# Patient Record
Sex: Female | Born: 1970 | Race: White | Hispanic: No | Marital: Married | Smoking: Never smoker
Health system: Southern US, Community
[De-identification: ages and names within clinical notes are randomized; demographics above are authoritative.]

## PROBLEM LIST (undated history)

## (undated) DIAGNOSIS — K219 Gastro-esophageal reflux disease without esophagitis: Secondary | ICD-10-CM

## (undated) DIAGNOSIS — Z973 Presence of spectacles and contact lenses: Secondary | ICD-10-CM

## (undated) DIAGNOSIS — N920 Excessive and frequent menstruation with regular cycle: Secondary | ICD-10-CM

## (undated) DIAGNOSIS — R112 Nausea with vomiting, unspecified: Secondary | ICD-10-CM

## (undated) DIAGNOSIS — J189 Pneumonia, unspecified organism: Secondary | ICD-10-CM

## (undated) DIAGNOSIS — Z9889 Other specified postprocedural states: Secondary | ICD-10-CM

## (undated) DIAGNOSIS — F419 Anxiety disorder, unspecified: Secondary | ICD-10-CM

## (undated) HISTORY — PX: BACK SURGERY: SHX140

## (undated) HISTORY — PX: OTHER SURGICAL HISTORY: SHX169

---

## 1999-12-09 ENCOUNTER — Other Ambulatory Visit: Admission: RE | Admit: 1999-12-09 | Discharge: 1999-12-09 | Payer: Self-pay | Admitting: Obstetrics & Gynecology

## 2001-01-20 ENCOUNTER — Other Ambulatory Visit: Admission: RE | Admit: 2001-01-20 | Discharge: 2001-01-20 | Payer: Self-pay | Admitting: Obstetrics & Gynecology

## 2002-03-29 ENCOUNTER — Inpatient Hospital Stay (HOSPITAL_COMMUNITY): Admission: AD | Admit: 2002-03-29 | Discharge: 2002-04-01 | Payer: Self-pay | Admitting: Obstetrics & Gynecology

## 2002-05-18 ENCOUNTER — Other Ambulatory Visit: Admission: RE | Admit: 2002-05-18 | Discharge: 2002-05-18 | Payer: Self-pay | Admitting: Obstetrics & Gynecology

## 2003-06-08 ENCOUNTER — Other Ambulatory Visit: Admission: RE | Admit: 2003-06-08 | Discharge: 2003-06-08 | Payer: Self-pay | Admitting: Obstetrics & Gynecology

## 2004-08-12 ENCOUNTER — Other Ambulatory Visit: Admission: RE | Admit: 2004-08-12 | Discharge: 2004-08-12 | Payer: Self-pay | Admitting: Obstetrics & Gynecology

## 2005-09-29 ENCOUNTER — Other Ambulatory Visit: Admission: RE | Admit: 2005-09-29 | Discharge: 2005-09-29 | Payer: Self-pay | Admitting: Obstetrics & Gynecology

## 2008-11-01 LAB — CONVERTED CEMR LAB: Pap Smear: NORMAL

## 2008-12-22 HISTORY — PX: INTRAUTERINE DEVICE (IUD) INSERTION: SHX5877

## 2009-01-17 ENCOUNTER — Ambulatory Visit: Payer: Self-pay | Admitting: Internal Medicine

## 2009-03-30 ENCOUNTER — Ambulatory Visit: Payer: Self-pay | Admitting: Internal Medicine

## 2009-05-08 ENCOUNTER — Ambulatory Visit: Payer: Self-pay | Admitting: Internal Medicine

## 2009-05-08 DIAGNOSIS — R635 Abnormal weight gain: Secondary | ICD-10-CM | POA: Insufficient documentation

## 2009-05-08 DIAGNOSIS — J020 Streptococcal pharyngitis: Secondary | ICD-10-CM

## 2009-05-08 LAB — CONVERTED CEMR LAB
AST: 27 units/L (ref 0–37)
Alkaline Phosphatase: 45 units/L (ref 39–117)
Bilirubin Urine: NEGATIVE
GFR calc non Af Amer: 145.58 mL/min (ref >60)
Glucose, Bld: 75 mg/dL (ref 70–99)
HCT: 33.8 % — ABNORMAL LOW (ref 36.0–46.0)
Hemoglobin: 12 g/dL (ref 12.0–15.0)
Leukocytes, UA: NEGATIVE
Lymphs Abs: 1.7 10*3/uL (ref 0.7–4.0)
MCHC: 35.4 g/dL (ref 30.0–36.0)
MCV: 87.8 fL (ref 78.0–100.0)
Monocytes Absolute: 0.5 10*3/uL (ref 0.1–1.0)
Monocytes Relative: 5.8 % (ref 3.0–12.0)
Neutrophils Relative %: 74.3 % (ref 43.0–77.0)
Nitrite: NEGATIVE
RBC: 3.85 M/uL — ABNORMAL LOW (ref 3.87–5.11)
Urine Glucose: NEGATIVE mg/dL
WBC: 8.6 10*3/uL (ref 4.5–10.5)

## 2009-05-14 ENCOUNTER — Encounter: Admission: RE | Admit: 2009-05-14 | Discharge: 2009-05-14 | Payer: Self-pay | Admitting: Internal Medicine

## 2009-05-14 ENCOUNTER — Telehealth: Payer: Self-pay | Admitting: Internal Medicine

## 2009-06-01 ENCOUNTER — Ambulatory Visit: Payer: Self-pay | Admitting: Endocrinology

## 2009-06-01 DIAGNOSIS — R61 Generalized hyperhidrosis: Secondary | ICD-10-CM

## 2009-12-28 ENCOUNTER — Encounter: Admission: RE | Admit: 2009-12-28 | Discharge: 2009-12-28 | Payer: Self-pay | Admitting: Obstetrics & Gynecology

## 2010-12-30 ENCOUNTER — Encounter
Admission: RE | Admit: 2010-12-30 | Discharge: 2010-12-30 | Payer: Self-pay | Source: Home / Self Care | Attending: Obstetrics & Gynecology | Admitting: Obstetrics & Gynecology

## 2011-05-09 NOTE — Discharge Summary (Signed)
[facility]  Patient:    Stephanie [name], Stephanie [name] Visit Number: [id number] MRN: [id number]          Service Type: OBS Location: 910A 9119 01 Attending Physician:  Minette Headland Dictated by:   Danie Chandler, R.N. Admit Date:  03/29/2002 Disc. Date: 04/01/02                             Discharge Summary  ADMISSION DIAGNOSES:          1. Intrauterine pregnancy at term.                               2. Surgically scarred uterus.                               3. For repeat cesarean delivery.  DISCHARGE DIAGNOSES:          1. Intrauterine pregnancy at term.                               2. Surgically scarred uterus.                               3. For repeat cesarean delivery.  PROCEDURES:                   On March 29, 2002, repeat low transverse cervical cesarean section.  REASON FOR ADMISSION:         Please see the dictated H&P.  HOSPITAL COURSE:              The patient was taken to the operating room and underwent the above-named procedure without complication.  This was productive of a viable female infant with Apgars of 9 at one minute and 9 at five minutes and an arterial cord pH of 7.24.  Postoperatively on day #1, the patients hemoglobin was 9.2, hematocrit 26.9, and white blood cell count 11.4.  The patient was tolerating liquids.  She was complaining of some itching and using Benadryl as needed.  On postoperative day #2, her itching was resolved.  She had good return of bowel function and was tolerating a regular diet.  She also had good pain control and was ambulating well without difficulty.  DISPOSITION:                  She was discharged home on postoperative day #3.  CONDITION ON DISCHARGE:       Good.  DIET:                         Regular as tolerated.  ACTIVITY:                     No heavy lifting.  No driving.  No vaginal entry.  FOLLOW-UP:                    She is to follow up in the office in two weeks for an incision  check.  SPECIAL INSTRUCTIONS:         She is to call for a temperature greater than 100 degrees, persistent nausea and vomiting, heavy vaginal bleeding, and/or  redness or drainage from the incision site.  DISCHARGE MEDICATIONS:        1. Prenatal vitamins one p.o. q.d.                               2. Motrin 600 mg every six hours as needed.                               3. Percocet 5 mg, #20, one to two p.o. q.6-8h.                                  p.r.n. pain. Dictated by:   Danie Chandler, R.N. Attending Physician:  Minette Headland DD:  04/01/02 TD:  04/02/02 Job: [id number]

## 2011-05-09 NOTE — H&P (Signed)
[facility]  PatientALDINA, Stephanie [name] Visit Number: [id number] MRN: [id number]          Service Type: Attending:  Freddy Finner, M.D. Dictated by:   Freddy Finner, M.D. Adm. Date:  03/29/02                           History and Physical  ADMITTING DIAGNOSIS:          Intrauterine pregnancy at term.  Surgically scarred uterus by two previous cesarean deliveries.  HISTORY:                      The patient is a 40 year old white married female, gravida 3, para 2 both by cesarean, who is now admitted after an uncomplicated prenatal course for repeat cesarean section.  Her current review of systems is negative.  PAST HISTORY:                 Recorded in detail in the prenatal record and will not be repeated.  PHYSICAL EXAMINATION:  HEENT:                        Grossly within normal limits.  Thyroid gland was not palpably enlarged.  Blood pressure in the office is 122/76.  CHEST:                        Clear to auscultation.  HEART:                        Normal sinus rhythm without murmurs, rubs or gallops.  ABDOMEN:                      Gravid.  Estimated fetal size of 8.5 pounds.  EXTREMITIES:                  +1 edema without cyanosis or clubbing.  ASSESSMENT:                   Intrauterine pregnancy at term, surgically scarred uterus.  PLAN:                         Cesarean delivery. Dictated by:   Freddy Finner, M.D. Attending:  Freddy Finner, M.D. DD:  03/28/02 TD:  03/28/02 Job: [id number]

## 2011-05-09 NOTE — Op Note (Signed)
[facility]  Patient:    Stephanie [name], Stephanie [name] Visit Number: [id number] MRN: [id number]          Service Type: OBS Location: 910A 9119 01 Attending Physician:  Minette Headland Dictated by:   Freddy Finner, M.D. Proc. Date: 03/29/02 Admit Date:  03/29/2002                             Operative Report  PREOPERATIVE DIAGNOSES:       1. Intrauterine pregnancy at term.                               2. Surgically scarred uterus.                               3. For repeat cesarean delivery.  POSTOPERATIVE DIAGNOSES:      1. Intrauterine pregnancy at term.                               2. Surgically scarred uterus.                               3. For repeat cesarean delivery.  OPERATION:                    Repeat low transverse cervical cesarean section.                               Delivery of a viable female infant, Apgars of 9                               and 9, arterial cord pH of 7.24.  SURGEON:                      Freddy Finner, M.D.  ANESTHESIA:                   Spinal.  ESTIMATED BLOOD LOSS:         600-800 cc.  INTRAOPERATIVE COMPLICATIONS:  None.  INDICATIONS:                  The patient is a 40 year old admitted at term for repeat cesarean delivery.  DESCRIPTION OF PROCEDURE:     She was brought in on the morning of surgery, brought to the operating room.  There she had placement of adequate spinal anesthesia, placed in the dorsal recumbent position with elevation of the right hip.  Abdomen was prepped and draped in the usual fashion.  A Foley catheter was placed using sterile technique.  Sterile drapes were applied.  A low abdominal transverse incision and carried sharply down to fascia.  The fascia was entered sharply and extended to the extent of the skin incision. The rectus sheath was developed superiorly and inferiorly with blunt and sharp dissection.  Rectus muscle was divided in the midline.  The peritoneum was entered  sharply and extended bluntly and sharply to the extent of the skin incision.  Bladder blade was placed.  Transverse incision was made initially in the peritoneum  overlying the lower uterine segment.  The bladder was bluntly dissected off the lower segment.  A transverse incision was made in the lower uterine segment and extended bluntly in a transverse direction.  The membranes were ruptured, fluid was clear.  Using a vacuum extractor, a viable female infant was then delivered without significant difficulty.  Incision was made in the left rectus muscle to allow additional room.  Infants birth weight was 9 pounds, Apgars of 9 and 9, assigned to Dagoberto Ligas, M.D. Arterial cord pH of 7.24.  The infant was a viable female.  Cord blood was obtained for routine and for arterial cord blood for gases.  The placenta was removed from the uterus with manual exploration, and manual removal of placenta was confirmed complete by exploration of the uterine cavity.  The edges of the uterine incision were grasped with ring forceps.  The incision was closed with running locking 0 Monocryl.  Additional suturing on the left was required for complete hemostasis.  Bladder flap was reapproximated with a single interrupted 0 Monocryl suture.  Tubes and ovaries were inspected and found to be normal.  Uterus was palpably normal.  Irrigation was carried out. The counts were correct.  The abdominal incision was closed in layers, and running 0 Monocryl was used to close the peritoneum and reapproximate the rectus muscles as well as repairing the defect created by the incision in the left rectus muscle.  Irrigation again was carried out.  Hemostasis was complete.  Fascia was closed with running 0 PDS.  Skin was closed with wide skin staples and quarter-inch Steri-Strips.  The patient tolerated the procedure well, was taken to recovery in good condition. Dictated by:   Freddy Finner, M.D. Attending Physician:  Minette Headland DD:  03/29/02 TD:  03/29/02 Job: [id number]

## 2011-10-30 ENCOUNTER — Other Ambulatory Visit: Payer: Self-pay | Admitting: Otolaryngology

## 2011-10-30 DIAGNOSIS — I8290 Acute embolism and thrombosis of unspecified vein: Secondary | ICD-10-CM

## 2011-11-05 ENCOUNTER — Other Ambulatory Visit: Payer: Self-pay

## 2011-12-29 ENCOUNTER — Ambulatory Visit
Admission: RE | Admit: 2011-12-29 | Discharge: 2011-12-29 | Disposition: A | Discharge status: Home / Self Care | Payer: 59 | Source: Ambulatory Visit | Attending: Otolaryngology | Admitting: Otolaryngology

## 2011-12-29 DIAGNOSIS — I8290 Acute embolism and thrombosis of unspecified vein: Secondary | ICD-10-CM

## 2011-12-29 MED ORDER — GADOBENATE DIMEGLUMINE 529 MG/ML IV SOLN
20.0000 mL | Freq: Once | INTRAVENOUS | Status: AC | PRN
  Administered 2011-12-29: 20 mL via INTRAVENOUS

## 2012-01-01 ENCOUNTER — Other Ambulatory Visit (HOSPITAL_COMMUNITY): Payer: Self-pay | Admitting: Otolaryngology

## 2012-01-01 DIAGNOSIS — I771 Stricture of artery: Secondary | ICD-10-CM

## 2012-01-02 ENCOUNTER — Encounter (HOSPITAL_COMMUNITY): Payer: Self-pay | Admitting: Pharmacy Technician

## 2012-01-07 ENCOUNTER — Ambulatory Visit (HOSPITAL_COMMUNITY)
Admission: RE | Admit: 2012-01-07 | Discharge: 2012-01-07 | Disposition: A | Discharge status: Home / Self Care | Payer: 59 | Source: Ambulatory Visit | Attending: Otolaryngology | Admitting: Otolaryngology

## 2012-01-07 ENCOUNTER — Other Ambulatory Visit (HOSPITAL_COMMUNITY): Payer: Self-pay | Admitting: Interventional Radiology

## 2012-01-07 DIAGNOSIS — G08 Intracranial and intraspinal phlebitis and thrombophlebitis: Secondary | ICD-10-CM

## 2012-01-07 DIAGNOSIS — I771 Stricture of artery: Secondary | ICD-10-CM

## 2012-01-08 ENCOUNTER — Other Ambulatory Visit (HOSPITAL_COMMUNITY): Payer: Self-pay | Admitting: Physician Assistant

## 2012-01-13 ENCOUNTER — Encounter (HOSPITAL_COMMUNITY): Payer: Self-pay | Admitting: Pharmacy Technician

## 2012-01-19 ENCOUNTER — Encounter (HOSPITAL_COMMUNITY): Payer: Self-pay

## 2012-01-19 ENCOUNTER — Encounter (HOSPITAL_COMMUNITY)
Admission: RE | Admit: 2012-01-19 | Discharge: 2012-01-19 | Disposition: A | Discharge status: Home / Self Care | Payer: 59 | Source: Ambulatory Visit | Attending: Anesthesiology | Admitting: Anesthesiology

## 2012-01-19 ENCOUNTER — Encounter (HOSPITAL_COMMUNITY)
Admission: RE | Admit: 2012-01-19 | Discharge: 2012-01-19 | Disposition: A | Discharge status: Home / Self Care | Payer: 59 | Source: Ambulatory Visit | Attending: Interventional Radiology | Admitting: Interventional Radiology

## 2012-01-19 ENCOUNTER — Other Ambulatory Visit: Payer: Self-pay

## 2012-01-19 HISTORY — DX: Nausea with vomiting, unspecified: R11.2

## 2012-01-19 HISTORY — DX: Other specified postprocedural states: Z98.890

## 2012-01-19 LAB — SURGICAL PCR SCREEN: MRSA, PCR: NEGATIVE

## 2012-01-19 LAB — BASIC METABOLIC PANEL
BUN: 9 mg/dL (ref 6–23)
Chloride: 104 mEq/L (ref 96–112)
Glucose, Bld: 78 mg/dL (ref 70–99)
Potassium: 4.2 mEq/L (ref 3.5–5.1)
Sodium: 139 mEq/L (ref 135–145)

## 2012-01-19 LAB — CBC
HCT: 42.5 % (ref 36.0–46.0)
Hemoglobin: 14.4 g/dL (ref 12.0–15.0)
MCH: 29.9 pg (ref 26.0–34.0)
MCHC: 33.9 g/dL (ref 30.0–36.0)
RBC: 4.82 MIL/uL (ref 3.87–5.11)

## 2012-01-19 LAB — HCG, SERUM, QUALITATIVE: Preg, Serum: NEGATIVE

## 2012-01-19 LAB — PROTIME-INR: INR: 1.04 (ref 0.00–1.49)

## 2012-01-19 LAB — DIFFERENTIAL
Basophils Relative: 1 % (ref 0–1)
Eosinophils Absolute: 0.1 10*3/uL (ref 0.0–0.7)
Lymphs Abs: 2.2 10*3/uL (ref 0.7–4.0)
Monocytes Absolute: 0.6 10*3/uL (ref 0.1–1.0)
Monocytes Relative: 6 % (ref 3–12)
Neutro Abs: 5.9 10*3/uL (ref 1.7–7.7)
Neutrophils Relative %: 67 % (ref 43–77)

## 2012-01-19 NOTE — Progress Notes (Signed)
Pt doesn't have a cardiologist and has never had a stress test/echo/heart cath

## 2012-01-19 NOTE — Progress Notes (Signed)
pts mom gets sick with anethesia

## 2012-01-19 NOTE — Pre-Procedure Instructions (Signed)
20 Stephanie [name]  01/19/2012   Your procedure is scheduled on:  Mon,Feb 4 @ 0800  Report to [facility] at 0600 AM.  Call this number if you have problems the morning of surgery: [phone]   Remember:   Do not eat food:After Midnight.  May have clear liquids: up to 4 Hours before arrival.(until 2:00am)  Clear liquids include soda, tea, black coffee, apple or grape juice, broth.  Take these medicines the morning of surgery with A SIP OF WATER:    Do not wear jewelry, make-up or nail polish.  Do not wear lotions, powders, or perfumes. You may wear deodorant.  Do not shave 48 hours prior to surgery.  Do not bring valuables to the hospital.  Contacts, dentures or bridgework may not be worn into surgery.  Leave suitcase in the car. After surgery it may be brought to your room.  For patients admitted to the hospital, checkout time is 11:00 AM the day of discharge.   Patients discharged the day of surgery will not be allowed to drive home.  Name and phone number of your driver:  Special Instructions: CHG Shower Use Special Wash: 1/2 bottle night before surgery and 1/2 bottle morning of surgery.   Please read over the following fact sheets that you were given: Pain Booklet, Coughing and Deep Breathing, MRSA Information and Surgical Site Infection Prevention

## 2012-01-20 NOTE — Consult Note (Signed)
Anesthesia:  Patient is a 41 year old female scheduled for a cerebral angiogram with possible angioplasty or stenting of right transverse sinus on 01/26/12.  Other history includes post-op N/V, "pinched nerve."  Labs noted.  EKG show NSR. CXR shows mild bronchitic changes. Plan to proceed.

## 2012-01-21 ENCOUNTER — Other Ambulatory Visit: Payer: Self-pay | Admitting: Radiology

## 2012-01-22 ENCOUNTER — Other Ambulatory Visit (HOSPITAL_COMMUNITY): Payer: Self-pay | Admitting: Physician Assistant

## 2012-01-26 ENCOUNTER — Ambulatory Visit (HOSPITAL_COMMUNITY): Payer: 59 | Admitting: Vascular Surgery

## 2012-01-26 ENCOUNTER — Ambulatory Visit (HOSPITAL_COMMUNITY)
Admission: RE | Admit: 2012-01-26 | Discharge: 2012-01-26 | Disposition: A | Discharge status: Home / Self Care | Payer: 59 | Source: Ambulatory Visit | Attending: Interventional Radiology | Admitting: Interventional Radiology

## 2012-01-26 ENCOUNTER — Encounter (HOSPITAL_COMMUNITY): Payer: Self-pay | Admitting: Vascular Surgery

## 2012-01-26 ENCOUNTER — Encounter (HOSPITAL_COMMUNITY): Payer: Self-pay

## 2012-01-26 ENCOUNTER — Encounter (HOSPITAL_COMMUNITY)
Admission: RE | Disposition: A | Discharge status: Home / Self Care | Payer: Self-pay | Source: Ambulatory Visit | Attending: Interventional Radiology

## 2012-01-26 DIAGNOSIS — G08 Intracranial and intraspinal phlebitis and thrombophlebitis: Secondary | ICD-10-CM

## 2012-01-26 DIAGNOSIS — H9319 Tinnitus, unspecified ear: Secondary | ICD-10-CM | POA: Insufficient documentation

## 2012-01-26 DIAGNOSIS — Z01810 Encounter for preprocedural cardiovascular examination: Secondary | ICD-10-CM | POA: Insufficient documentation

## 2012-01-26 DIAGNOSIS — Z01812 Encounter for preprocedural laboratory examination: Secondary | ICD-10-CM | POA: Insufficient documentation

## 2012-01-26 DIAGNOSIS — I10 Essential (primary) hypertension: Secondary | ICD-10-CM | POA: Insufficient documentation

## 2012-01-26 DIAGNOSIS — R9409 Abnormal results of other function studies of central nervous system: Secondary | ICD-10-CM | POA: Insufficient documentation

## 2012-01-26 DIAGNOSIS — Z01818 Encounter for other preprocedural examination: Secondary | ICD-10-CM | POA: Insufficient documentation

## 2012-01-26 LAB — PLATELET INHIBITION P2Y12: Platelet Function  P2Y12: 171 [PRU] — ABNORMAL LOW (ref 194–418)

## 2012-01-26 SURGERY — RADIOLOGY WITH ANESTHESIA
Anesthesia: General

## 2012-01-26 MED ORDER — CEFAZOLIN SODIUM-DEXTROSE 2-3 GM-% IV SOLR: 2.0000 g | INTRAVENOUS | Status: DC

## 2012-01-26 MED ORDER — LACTATED RINGERS IV SOLN: INTRAVENOUS | Status: DC | PRN

## 2012-01-26 MED ORDER — HYDROMORPHONE HCL PF 1 MG/ML IJ SOLN
INTRAMUSCULAR | Status: AC
  Filled 2012-01-26: qty 1

## 2012-01-26 MED ORDER — CEFAZOLIN SODIUM-DEXTROSE 2-3 GM-% IV SOLR
INTRAVENOUS | Status: AC
  Filled 2012-01-26: qty 50

## 2012-01-26 MED ORDER — MIDAZOLAM HCL 5 MG/5ML IJ SOLN
INTRAMUSCULAR | Status: DC | PRN
  Administered 2012-01-26: 1 mg via INTRAVENOUS

## 2012-01-26 MED ORDER — CEFAZOLIN SODIUM 1-5 GM-% IV SOLN: 1.0000 g | INTRAVENOUS | Status: DC

## 2012-01-26 MED ORDER — CLOPIDOGREL BISULFATE 75 MG PO TABS
ORAL_TABLET | ORAL | Status: AC
  Filled 2012-01-26: qty 1

## 2012-01-26 MED ORDER — NIMODIPINE 30 MG PO CAPS
ORAL_CAPSULE | ORAL | Status: AC
  Filled 2012-01-26: qty 2

## 2012-01-26 MED ORDER — NIMODIPINE 30 MG PO CAPS
60.0000 mg | ORAL_CAPSULE | ORAL | Status: AC
Start: 2012-01-26 — End: 2012-01-26
  Administered 2012-01-26: 60 mg via ORAL

## 2012-01-26 MED ORDER — CLOPIDOGREL BISULFATE 75 MG PO TABS
75.0000 mg | ORAL_TABLET | ORAL | Status: AC
  Administered 2012-01-26: 75 mg via ORAL

## 2012-01-26 MED ORDER — HYDROMORPHONE HCL PF 1 MG/ML IJ SOLN
0.2500 mg | INTRAMUSCULAR | Status: DC | PRN
  Administered 2012-01-26: 0.25 mg via INTRAVENOUS

## 2012-01-26 MED ORDER — IOHEXOL 300 MG/ML  SOLN
300.0000 mL | Freq: Once | INTRAMUSCULAR | Status: AC | PRN
  Administered 2012-01-26: 95 mL via INTRA_ARTERIAL

## 2012-01-26 MED ORDER — SODIUM CHLORIDE 0.9 % IV SOLN: INTRAVENOUS | Status: DC

## 2012-01-26 MED ORDER — SODIUM CHLORIDE 0.9 % IV SOLN
INTRAVENOUS | Status: DC | PRN
  Administered 2012-01-26: 08:00:00 via INTRAVENOUS

## 2012-01-26 MED ORDER — FENTANYL CITRATE 0.05 MG/ML IJ SOLN
INTRAMUSCULAR | Status: DC | PRN
  Administered 2012-01-26 (×2): 50 ug via INTRAVENOUS

## 2012-01-26 MED ORDER — DROPERIDOL 2.5 MG/ML IJ SOLN: 0.6250 mg | INTRAMUSCULAR | Status: DC | PRN

## 2012-01-26 MED ORDER — ASPIRIN EC 325 MG PO TBEC
325.0000 mg | DELAYED_RELEASE_TABLET | ORAL | Status: AC
  Administered 2012-01-26: 325 mg via ORAL

## 2012-01-26 MED ORDER — SODIUM CHLORIDE 0.9 % IV SOLN
10.0000 mg | INTRAVENOUS | Status: DC | PRN
  Administered 2012-01-26: 1 ug via INTRAVENOUS

## 2012-01-26 MED ORDER — ASPIRIN EC 325 MG PO TBEC
DELAYED_RELEASE_TABLET | ORAL | Status: AC
  Filled 2012-01-26: qty 1

## 2012-01-26 NOTE — Procedures (Signed)
S/P 4 vessel cerebral arteriogram  Rt CFA approach . Preliminary findings  1.70  To 75 % stenosis of rt  transverse  sinus ,distally

## 2012-01-26 NOTE — Preoperative (Signed)
Beta Blockers   Reason not to administer Beta Blockers:Not Applicable 

## 2012-01-26 NOTE — H&P (Signed)
Stephanie [name] is an 41 y.o. female.   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 PONV (postoperative nausea and vomiting)   . Pinched nerve     left side-leg    Past Surgical History  Procedure Date  . Cyst on throat     at age 48  . Cesarean section 94/97/03    Family History  Problem Relation Age of Onset  . Anesthesia problems Mother    Social History:  reports that she has never smoked. She does not have any smokeless tobacco history on file. She reports that she does not drink alcohol or use illicit drugs.  Allergies:  Allergies  Allergen Reactions  . Morphine And Related Itching    Medications Prior to Admission  Medication Dose Route Frequency Provider Last Rate Last Dose  . 0.9 %  sodium chloride infusion   Intravenous Continuous Oneal Grout, MD      . aspirin EC 325 MG tablet           . aspirin EC tablet 325 mg  325 mg Oral 60 min Pre-Op Oneal Grout, MD      . ceFAZolin (ANCEF) 2-3 GM-% IVPB SOLR           . ceFAZolin (ANCEF) IVPB 2 g/50 mL premix  2 g Intravenous 60 min Pre-Op Oneal Grout, MD      . clopidogrel (PLAVIX) 75 MG tablet           . clopidogrel (PLAVIX) tablet 75 mg  75 mg Oral 60 min Pre-Op Oneal Grout, MD      . niMODipine (NIMOTOP) 30 MG capsule           . niMODipine (NIMOTOP) capsule 60 mg  60 mg Oral 60 min Pre-Op Oneal Grout, MD      . phenylephrine (NEO-SYNEPHRINE) 0.04 mg/mL in sodium chloride 0.9 % 250 mL infusion  10 mg  Continuous PRN Sande Brothers, CRNA   1 mcg at 01/26/12 0720  . DISCONTD: ceFAZolin (ANCEF) IVPB 1 g/50 mL premix  1 g Intravenous 60 min Pre-Op Oneal Grout, MD       Medications Prior to Admission  Medication Sig Dispense Refill  . aspirin 325 MG tablet Take 325 mg by mouth daily.      . clopidogrel  (PLAVIX) 75 MG tablet Take 75 mg by mouth daily.        Results for orders placed during the hospital encounter of 01/26/12 (from the past 48 hour(s))  PLATELET INHIBITION P2Y12     Status: Abnormal   Collection Time   01/26/12  6:42 AM      Component Value Range Comment   Platelet Function  P2Y12 171 (*) 194 - 418 (PRU)    No results found.  Review of Systems  Constitutional: Negative for fever.  Eyes: Negative for blurred vision.  Respiratory: Negative for cough.   Cardiovascular: Negative for chest pain.  Gastrointestinal: Negative for nausea, vomiting and abdominal pain.  Neurological: Negative for headaches.    Last menstrual period 12/19/2011. Physical Exam  Constitutional: She is oriented to person, place, and time. She appears well-developed and well-nourished.  HENT:  Head: Normocephalic.  Eyes: EOM are normal.  Neck: Normal range of motion.  Cardiovascular: Normal rate and regular rhythm.   No murmur heard. Respiratory: Effort normal  and breath sounds normal. She has no wheezes.  GI: Soft. Bowel sounds are normal. There is no tenderness.  Musculoskeletal: Normal range of motion.  Neurological: She is alert and oriented to person, place, and time.  Skin: Skin is warm and dry.     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 Consent signed.Pt understands if intervention performed she will be admitted to Neuro ICU overnight  Macallister Ashmead A 01/26/2012, 7:21 AM

## 2012-01-26 NOTE — Anesthesia Preprocedure Evaluation (Signed)
Anesthesia Evaluation  Patient identified by MRN, date of birth, ID band Patient awake    Reviewed: Allergy & Precautions, H&P , NPO status , Patient's Chart, lab work & pertinent test results  History of Anesthesia Complications (+) PONVNegative for: history of anesthetic complications  Airway Mallampati: II TM Distance: >3 FB Neck ROM: Full    Dental  (+) Dental Advisory Given   Pulmonary neg pulmonary ROS,          Cardiovascular hypertension,  Pt sts borderline no RX   Neuro/Psych Negative Psych ROS   GI/Hepatic negative GI ROS, Neg liver ROS,   Endo/Other  Negative Endocrine ROS  Renal/GU negative Renal ROS  Genitourinary negative   Musculoskeletal negative musculoskeletal ROS (+)   Abdominal   Peds negative pediatric ROS (+)  Hematology negative hematology ROS (+) On Plavix/ASA   Anesthesia Other Findings   Reproductive/Obstetrics negative OB ROS                           Anesthesia Physical Anesthesia Plan  ASA: III  Anesthesia Plan: General and MAC   Post-op Pain Management:    Induction: Intravenous  Airway Management Planned: Nasal Cannula and Oral ETT  Additional Equipment: Arterial line  Intra-op Plan:   Post-operative Plan: Extubation in OR and Possible Post-op intubation/ventilation  Informed Consent:   Dental advisory given  Plan Discussed with: CRNA, Anesthesiologist and Surgeon  Anesthesia Plan Comments:         Anesthesia Quick Evaluation

## 2012-01-26 NOTE — Transfer of Care (Signed)
Immediate Anesthesia Transfer of Care Note  Patient: Stephanie [name]  Procedure(s) Performed:  RADIOLOGY WITH ANESTHESIA  Patient Location: PACU  Anesthesia Type: MAC  Level of Consciousness: awake, alert  and oriented  Airway & Oxygen Therapy: Patient Spontanous Breathing  Post-op Assessment: Report given to PACU RN and Post -op Vital signs reviewed and stable  Post vital signs: Reviewed and stable  Complications: No apparent anesthesia complications

## 2012-01-26 NOTE — Anesthesia Postprocedure Evaluation (Signed)
Anesthesia Post Note  Patient: Stephanie [name]  Procedure(s) Performed:  RADIOLOGY WITH ANESTHESIA  Anesthesia type: MAC  Patient location: PACU  Post pain: Pain level controlled  Post assessment: Patient's Cardiovascular Status Stable  Last Vitals:  Filed Vitals:   01/26/12 1308  BP: 113/59  Pulse: 84  Temp:   Resp: 20    Post vital signs: Reviewed and stable  Level of consciousness: sedated  Complications: No apparent anesthesia complications

## 2012-01-26 NOTE — ED Notes (Signed)
Vitals and Sedation Per anesthesia flowchart and records

## 2012-01-27 ENCOUNTER — Ambulatory Visit (INDEPENDENT_AMBULATORY_CARE_PROVIDER_SITE_OTHER): Payer: 59 | Admitting: Family Medicine

## 2012-01-27 ENCOUNTER — Encounter: Payer: Self-pay | Admitting: Family Medicine

## 2012-01-27 VITALS — BP 142/90 | HR 80 | Temp 98.3°F | Resp 16 | Ht 64.0 in | Wt 250.0 lb

## 2012-01-27 DIAGNOSIS — M25559 Pain in unspecified hip: Secondary | ICD-10-CM

## 2012-01-27 DIAGNOSIS — M5430 Sciatica, unspecified side: Secondary | ICD-10-CM

## 2012-01-27 MED ORDER — PREDNISONE 20 MG PO TABS: ORAL_TABLET | ORAL | Status: DC

## 2012-01-27 MED ORDER — CYCLOBENZAPRINE HCL 5 MG PO TABS: 5.0000 mg | ORAL_TABLET | Freq: Three times a day (TID) | ORAL | Status: AC | PRN

## 2012-01-27 MED ORDER — NAPROXEN 500 MG PO TABS: 500.0000 mg | ORAL_TABLET | Freq: Two times a day (BID) | ORAL | Status: DC

## 2012-01-27 NOTE — Patient Instructions (Signed)
Thank you for coming in today.  I appreciate your patience as we become more comfortable with our computer system.  Today you saw Stephanie Garland, MD. I hope you feel better quickly. If you were not given printed prescriptions today, your medications have been sent to your specified pharmacy and can be picked up there.  Please review the information below regarding your diagnosis(es) at your leisure.      Sciatica with Rehab The sciatic nerve runs from the back down the leg and is responsible for sensation and control of the muscles in the back (posterior) side of the thigh, lower leg, and foot. Sciatica is a condition that is characterized by inflammation of this nerve.  SYMPTOMS   Signs of nerve damage, including numbness and/or weakness along the posterior side of the lower extremity.   Pain in the back of the thigh that may also travel down the leg.   Pain that worsens when sitting for long periods of time.   Occasionally, pain in the back or buttock.  CAUSES  Inflammation of the sciatic nerve is the cause of sciatica. The inflammation is due to something irritating the nerve. Common sources of irritation include:  Sitting for long periods of time.   Direct trauma to the nerve.   Arthritis of the spine.   Herniated or ruptured disk.   Slipping of the vertebrae (spondylolithesis)   Pressure from soft tissues, such as muscles or ligament-like tissue (fascia).  RISK INCREASES WITH:  Sports that place pressure or stress on the spine (football or weightlifting).   Poor strength and flexibility.   Failure to warm-up properly before activity.   Family history of low back pain or disk disorders.   Previous back injury or surgery.   Poor body mechanics, especially when lifting, or poor posture.  PREVENTION   Warm up and stretch properly before activity.   Maintain physical fitness:   Strength, flexibility, and endurance.   Cardiovascular fitness.   Learn and use proper  technique, especially with posture and lifting. When possible, have coach correct improper technique.   Avoid activities that place stress on the spine.  PROGNOSIS If treated properly, then sciatica usually resolves within 6 weeks. However, occasionally surgery is necessary.  RELATED COMPLICATIONS   Permanent nerve damage, including pain, numbness, tingle, or weakness.   Chronic back pain.   Risks of surgery: infection, bleeding, nerve damage, or damage to surrounding tissues.  TREATMENT Treatment initially involves resting from any activities that aggravate your symptoms. The use of ice and medication may help reduce pain and inflammation. The use of strengthening and stretching exercises may help reduce pain with activity. These exercises may be performed at home or with referral to a therapist. A therapist may recommend further treatments, such as transcutaneous electronic nerve stimulation (TENS) or ultrasound. Your caregiver may recommend corticosteroid injections to help reduce inflammation of the sciatic nerve. If symptoms persist despite non-surgical (conservative) treatment, then surgery may be recommended. MEDICATION  If pain medication is necessary, then nonsteroidal anti-inflammatory medications, such as aspirin and ibuprofen, or other minor pain relievers, such as acetaminophen, are often recommended.   Do not take pain medication for 7 days before surgery.   Prescription pain relievers may be given if deemed necessary by your caregiver. Use only as directed and only as much as you need.   Ointments applied to the skin may be helpful.   Corticosteroid injections may be given by your caregiver. These injections should be reserved for the most  serious cases, because they may only be given a certain number of times.  HEAT AND COLD  Cold treatment (icing) relieves pain and reduces inflammation. Cold treatment should be applied for 10 to 15 minutes every 2 to 3 hours for  inflammation and pain and immediately after any activity that aggravates your symptoms. Use ice packs or massage the area with a piece of ice (ice massage).   Heat treatment may be used prior to performing the stretching and strengthening activities prescribed by your caregiver, physical therapist, or athletic trainer. Use a heat pack or soak the injury in warm water.  SEEK MEDICAL CARE IF:  Treatment seems to offer no benefit, or the condition worsens.   Any medications produce adverse side effects.  EXERCISES  RANGE OF MOTION (ROM) AND STRETCHING EXERCISES - Sciatica Most people with sciatic will find that their symptoms worsen with either excessive bending forward (flexion) or arching at the low back (extension). The exercises which will help resolve your symptoms will focus on the opposite motion. Your physician, physical therapist or athletic trainer will help you determine which exercises will be most helpful to resolve your low back pain. Do not complete any exercises without first consulting with your clinician. Discontinue any exercises which worsen your symptoms until you speak to your clinician. If you have pain, numbness or tingling which travels down into your buttocks, leg or foot, the goal of the therapy is for these symptoms to move closer to your back and eventually resolve. Occasionally, these leg symptoms will get better, but your low back pain may worsen; this is typically an indication of progress in your rehabilitation. Be certain to be very alert to any changes in your symptoms and the activities in which you participated in the 24 hours prior to the change. Sharing this information with your clinician will allow him/her to most efficiently treat your condition. These exercises may help you when beginning to rehabilitate your injury. Your symptoms may resolve with or without further involvement from your physician, physical therapist or athletic trainer. While completing these  exercises, remember:   Restoring tissue flexibility helps normal motion to return to the joints. This allows healthier, less painful movement and activity.   An effective stretch should be held for at least 30 seconds.   A stretch should never be painful. You should only feel a gentle lengthening or release in the stretched tissue.  FLEXION RANGE OF MOTION AND STRETCHING EXERCISES: STRETCH - Flexion, Single Knee to Chest   Lie on a firm bed or floor with both legs extended in front of you.   Keeping one leg in contact with the floor, bring your opposite knee to your chest. Hold your leg in place by either grabbing behind your thigh or at your knee.   Pull until you feel a gentle stretch in your low back. Hold __________ seconds.   Slowly release your grasp and repeat the exercise with the opposite side.  Repeat __________ times. Complete this exercise __________ times per day.  STRETCH - Flexion, Double Knee to Chest  Lie on a firm bed or floor with both legs extended in front of you.   Keeping one leg in contact with the floor, bring your opposite knee to your chest.   Tense your stomach muscles to support your back and then lift your other knee to your chest. Hold your legs in place by either grabbing behind your thighs or at your knees.   Pull both knees toward your  chest until you feel a gentle stretch in your low back. Hold __________ seconds.   Tense your stomach muscles and slowly return one leg at a time to the floor.  Repeat __________ times. Complete this exercise __________ times per day.  STRETCH - Low Trunk Rotation   Lie on a firm bed or floor. Keeping your legs in front of you, bend your knees so they are both pointed toward the ceiling and your feet are flat on the floor.   Extend your arms out to the side. This will stabilize your upper body by keeping your shoulders in contact with the floor.   Gently and slowly drop both knees together to one side until you feel  a gentle stretch in your low back. Hold for __________ seconds.   Tense your stomach muscles to support your low back as you bring your knees back to the starting position. Repeat the exercise to the other side.  Repeat __________ times. Complete this exercise __________ times per day  EXTENSION RANGE OF MOTION AND FLEXIBILITY EXERCISES: STRETCH - Extension, Prone on Elbows  Lie on your stomach on the floor, a bed will be too soft. Place your palms about shoulder width apart and at the height of your head.   Place your elbows under your shoulders. If this is too painful, stack pillows under your chest.   Allow your body to relax so that your hips drop lower and make contact more completely with the floor.   Hold this position for __________ seconds.   Slowly return to lying flat on the floor.  Repeat __________ times. Complete this exercise __________ times per day.  RANGE OF MOTION - Extension, Prone Press Ups  Lie on your stomach on the floor, a bed will be too soft. Place your palms about shoulder width apart and at the height of your head.   Keeping your back as relaxed as possible, slowly straighten your elbows while keeping your hips on the floor. You may adjust the placement of your hands to maximize your comfort. As you gain motion, your hands will come more underneath your shoulders.   Hold this position __________ seconds.   Slowly return to lying flat on the floor.  Repeat __________ times. Complete this exercise __________ times per day.  STRENGTHENING EXERCISES - Sciatica  These exercises may help you when beginning to rehabilitate your injury. These exercises should be done near your "sweet spot." This is the neutral, low-back arch, somewhere between fully rounded and fully arched, that is your least painful position. When performed in this safe range of motion, these exercises can be used for people who have either a flexion or extension based injury. These exercises may  resolve your symptoms with or without further involvement from your physician, physical therapist or athletic trainer. While completing these exercises, remember:   Muscles can gain both the endurance and the strength needed for everyday activities through controlled exercises.   Complete these exercises as instructed by your physician, physical therapist or athletic trainer. Progress with the resistance and repetition exercises only as your caregiver advises.   You may experience muscle soreness or fatigue, but the pain or discomfort you are trying to eliminate should never worsen during these exercises. If this pain does worsen, stop and make certain you are following the directions exactly. If the pain is still present after adjustments, discontinue the exercise until you can discuss the trouble with your clinician.  STRENGTHENING - Deep Abdominals, Pelvic Tilt  Lie on a firm bed or floor. Keeping your legs in front of you, bend your knees so they are both pointed toward the ceiling and your feet are flat on the floor.   Tense your lower abdominal muscles to press your low back into the floor. This motion will rotate your pelvis so that your tail bone is scooping upwards rather than pointing at your feet or into the floor.   With a gentle tension and even breathing, hold this position for __________ seconds.  Repeat __________ times. Complete this exercise __________ times per day.  STRENGTHENING - Abdominals, Crunches   Lie on a firm bed or floor. Keeping your legs in front of you, bend your knees so they are both pointed toward the ceiling and your feet are flat on the floor. Cross your arms over your chest.   Slightly tip your chin down without bending your neck.   Tense your abdominals and slowly lift your trunk high enough to just clear your shoulder blades. Lifting higher can put excessive stress on the low back and does not further strengthen your abdominal muscles.   Control your  return to the starting position.  Repeat __________ times. Complete this exercise __________ times per day.  STRENGTHENING - Quadruped, Opposite UE/LE Lift  Assume a hands and knees position on a firm surface. Keep your hands under your shoulders and your knees under your hips. You may place padding under your knees for comfort.   Find your neutral spine and gently tense your abdominal muscles so that you can maintain this position. Your shoulders and hips should form a rectangle that is parallel with the floor and is not twisted.   Keeping your trunk steady, lift your right hand no higher than your shoulder and then your left leg no higher than your hip. Make sure you are not holding your breath. Hold this position __________ seconds.   Continuing to keep your abdominal muscles tense and your back steady, slowly return to your starting position. Repeat with the opposite arm and leg.  Repeat __________ times. Complete this exercise __________ times per day.  STRENGTHENING - Abdominals and Quadriceps, Straight Leg Raise   Lie on a firm bed or floor with both legs extended in front of you.   Keeping one leg in contact with the floor, bend the other knee so that your foot can rest flat on the floor.   Find your neutral spine, and tense your abdominal muscles to maintain your spinal position throughout the exercise.   Slowly lift your straight leg off the floor about 6 inches for a count of 15, making sure to not hold your breath.   Still keeping your neutral spine, slowly lower your leg all the way to the floor.  Repeat this exercise with each leg __________ times. Complete this exercise __________ times per day. POSTURE AND BODY MECHANICS CONSIDERATIONS - Sciatica Keeping correct posture when sitting, standing or completing your activities will reduce the stress put on different body tissues, allowing injured tissues a chance to heal and limiting painful experiences. The following are general  guidelines for improved posture. Your physician or physical therapist will provide you with any instructions specific to your needs. While reading these guidelines, remember:  The exercises prescribed by your provider will help you have the flexibility and strength to maintain correct postures.   The correct posture provides the optimal environment for your joints to work. All of your joints have less wear and tear when properly  supported by a spine with good posture. This means you will experience a healthier, less painful body.   Correct posture must be practiced with all of your activities, especially prolonged sitting and standing. Correct posture is as important when doing repetitive low-stress activities (typing) as it is when doing a single heavy-load activity (lifting).  RESTING POSITIONS Consider which positions are most painful for you when choosing a resting position. If you have pain with flexion-based activities (sitting, bending, stooping, squatting), choose a position that allows you to rest in a less flexed posture. You would want to avoid curling into a fetal position on your side. If your pain worsens with extension-based activities (prolonged standing, working overhead), avoid resting in an extended position such as sleeping on your stomach. Most people will find more comfort when they rest with their spine in a more neutral position, neither too rounded nor too arched. Lying on a non-sagging bed on your side with a pillow between your knees, or on your back with a pillow under your knees will often provide some relief. Keep in mind, being in any one position for a prolonged period of time, no matter how correct your posture, can still lead to stiffness. PROPER SITTING POSTURE In order to minimize stress and discomfort on your spine, you must sit with correct posture Sitting with good posture should be effortless for a healthy body. Returning to good posture is a gradual process. Many  people can work toward this most comfortably by using various supports until they have the flexibility and strength to maintain this posture on their own. When sitting with proper posture, your ears will fall over your shoulders and your shoulders will fall over your hips. You should use the back of the chair to support your upper back. Your low back will be in a neutral position, just slightly arched. You may place a small pillow or folded towel at the base of your low back for support.  When working at a desk, create an environment that supports good, upright posture. Without extra support, muscles fatigue and lead to excessive strain on joints and other tissues. Keep these recommendations in mind: CHAIR:   A chair should be able to slide under your desk when your back makes contact with the back of the chair. This allows you to work closely.   The chair's height should allow your eyes to be level with the upper part of your monitor and your hands to be slightly lower than your elbows.  BODY POSITION  Your feet should make contact with the floor. If this is not possible, use a foot rest.   Keep your ears over your shoulders. This will reduce stress on your neck and low back.  INCORRECT SITTING POSTURES   If you are feeling tired and unable to assume a healthy sitting posture, do not slouch or slump. This puts excessive strain on your back tissues, causing more damage and pain. Healthier options include:   Using more support, like a lumbar pillow.   Switching tasks to something that requires you to be upright or walking.   Talking a brief walk.   Lying down to rest in a neutral-spine position.  PROLONGED STANDING WHILE SLIGHTLY LEANING FORWARD  When completing a task that requires you to lean forward while standing in one place for a long time, place either foot up on a stationary 2-4 inch high object to help maintain the best posture. When both feet are on the ground, the low  back tends to  lose its slight inward curve. If this curve flattens (or becomes too large), then the back and your other joints will experience too much stress, fatigue more quickly and can cause pain.  CORRECT STANDING POSTURES Proper standing posture should be assumed with all daily activities, even if they only take a few moments, like when brushing your teeth. As in sitting, your ears should fall over your shoulders and your shoulders should fall over your hips. You should keep a slight tension in your abdominal muscles to brace your spine. Your tailbone should point down to the ground, not behind your body, resulting in an over-extended swayback posture.  INCORRECT STANDING POSTURES  Common incorrect standing postures include a forward head, locked knees and/or an excessive swayback. WALKING Walk with an upright posture. Your ears, shoulders and hips should all line-up. PROLONGED ACTIVITY IN A FLEXED POSITION When completing a task that requires you to bend forward at your waist or lean over a low surface, try to find a way to stabilize 3 of 4 of your limbs. You can place a hand or elbow on your thigh or rest a knee on the surface you are reaching across. This will provide you more stability so that your muscles do not fatigue as quickly. By keeping your knees relaxed, or slightly bent, you will also reduce stress across your low back. CORRECT LIFTING TECHNIQUES DO :   Assume a wide stance. This will provide you more stability and the opportunity to get as close as possible to the object which you are lifting.   Tense your abdominals to brace your spine; then bend at the knees and hips. Keeping your back locked in a neutral-spine position, lift using your leg muscles. Lift with your legs, keeping your back straight.   Test the weight of unknown objects before attempting to lift them.   Try to keep your elbows locked down at your sides in order get the best strength from your shoulders when carrying an  object.   Always ask for help when lifting heavy or awkward objects.  INCORRECT LIFTING TECHNIQUES DO NOT:   Lock your knees when lifting, even if it is a small object.   Bend and twist. Pivot at your feet or move your feet when needing to change directions.   Assume that you cannot safely pick up a paperclip without proper posture.  Document Released: 12/08/2005 Document Revised: 08/20/2011 Document Reviewed: 03/22/2009 [facility] Patient Information 2012 [place], [place].

## 2012-01-27 NOTE — Progress Notes (Signed)
  Subjective:    Patient ID: Stephanie [name], female    DOB: 02-08-1971, 41 y.o.   MRN: [id number]  HPI 41 yo female with off and on pain in left low back/buttock/leg - hasn't been this bad before. Laying flat on back makes it worse.  Started today about lunch while standing up blow drying hair.  Hasn't taken anything.  No heat or ice.    Cerebral arteriogram yesterday to eval sinus.  Propped left leg up special because it bothers her sometimes.    Review of Systems [facility] Primary radiology reading by Dr. Georgiana Shore:     Objective:   Physical Exam Obese, uncomfortable appearing ttp over piriformis Normal strength in both lower extremities.  Normal DTRS in patella and achilles bilaterally. Antalgic gait       Assessment & Plan:  Hip pain Sciatica  pred taper Naproxen Flexeril Exercises/stretches heat

## 2012-01-28 ENCOUNTER — Telehealth: Payer: Self-pay

## 2012-01-28 NOTE — Telephone Encounter (Signed)
.  umfc  Pt has oxycodone at home and is requesting permission to take it with the 3 RX given by Dr. Patsy Lager. Wants to be sure it is safe to do so.  Pt in a lot of pain

## 2012-01-29 ENCOUNTER — Ambulatory Visit (INDEPENDENT_AMBULATORY_CARE_PROVIDER_SITE_OTHER): Payer: 59 | Admitting: Family Medicine

## 2012-01-29 ENCOUNTER — Telehealth (HOSPITAL_COMMUNITY): Payer: Self-pay

## 2012-01-29 DIAGNOSIS — G8929 Other chronic pain: Secondary | ICD-10-CM

## 2012-01-29 DIAGNOSIS — M5416 Radiculopathy, lumbar region: Secondary | ICD-10-CM

## 2012-01-29 DIAGNOSIS — M79609 Pain in unspecified limb: Secondary | ICD-10-CM

## 2012-01-29 DIAGNOSIS — IMO0002 Reserved for concepts with insufficient information to code with codable children: Secondary | ICD-10-CM

## 2012-01-29 MED ORDER — TRIAMCINOLONE ACETONIDE 40 MG/ML IJ SUSP
80.0000 mg | Freq: Once | INTRAMUSCULAR | Status: AC
  Administered 2012-01-29: 80 mg via INTRAMUSCULAR

## 2012-01-29 MED ORDER — OXYCODONE-ACETAMINOPHEN 10-325 MG PO TABS: 1.0000 | ORAL_TABLET | ORAL | Status: AC | PRN

## 2012-01-29 MED ORDER — GABAPENTIN 300 MG PO CAPS: ORAL_CAPSULE | ORAL | Status: DC

## 2012-01-29 MED ORDER — PROMETHAZINE HCL 25 MG/ML IJ SOLN: 50.0000 mg | Freq: Once | INTRAMUSCULAR | Status: DC

## 2012-01-29 MED ORDER — NALBUPHINE HCL 20 MG/ML IJ SOLN: 20.0000 mg | Freq: Once | INTRAMUSCULAR | Status: DC

## 2012-01-29 NOTE — Patient Instructions (Signed)
You will be set up for a lumbar MRI to help determine if further treatment will be needed.  Gabapentin will make you drowsy. Follow up after the MRI has been completed.

## 2012-01-29 NOTE — Telephone Encounter (Signed)
pts husband called again about same message. Pt went ahead and took the oxycodone she had at home, but is still not finding relief for pain. Husband asked [place] to call him at [phone] b/c she is sleeping.  bf

## 2012-01-29 NOTE — Telephone Encounter (Signed)
SPOKE TO PTS HUSBAND AND ADVISED PT TO RTC FOR EVAL IF STILL IN A LOT OF PAIN. INFORMED IT WAS OK TO TAKE THE OXYCODONE WITH THE OTHER MEDS. PT WILL RTC TODAY OR TOMORROW

## 2012-01-29 NOTE — Telephone Encounter (Signed)
Yes, it was safe to take the oxycodone with the medications prescribed by Dr. Georgiana Shore.  However, if she's still in that much pain, advise her to RTC for re-evaluation. csj

## 2012-01-29 NOTE — Progress Notes (Signed)
  Subjective:    Patient ID: Stephanie [name], female    DOB: [birth year], 41 y.o.   MRN: [id number]  HPI F/U from her 01/27/12 OV.  She is continuing to have pain in the left hip and left leg.  No better or worse.  meds she was prescribed at her last ov is not helping.  She has been using percocet she had at home. This is not helping. Noted tingling in left foot for the first time today.  No calf pain. No CP/SOB. No fevers.Pain aggrevated with movement.   Review of Systems     Objective:   Physical Exam Resp; no compromise.  Speaking in full sentences Low back:  TTP left paraspinal muscular region.Positive seated ipsilatateral and contralateral SLR on the left. Left hip: FROM.  No pain with resistant motion. Neuro: full strength. Altered gait. Sensation intact.        Assessment & Plan:  Left leg pain - suspect lumbar radiculopathy Lumbar pain  Nubain 10 mg IM/phenergan 50 mg IM  neurontin 300 mg 1-2 po qhs #60 1rf. SED  Arrange MRI of lumbar spine due to intensity of the pain   Percocet 10 mg 1 po 4-6hrs prn #30  Hold naproxen until finish prednisone  Kenalog 80 mg IM  F/U after MRI, sooner if worse

## 2012-01-29 NOTE — Telephone Encounter (Signed)
LMOM at # husband left on phone message to CB.

## 2012-01-30 ENCOUNTER — Emergency Department (HOSPITAL_COMMUNITY): Payer: 59

## 2012-01-30 ENCOUNTER — Other Ambulatory Visit (HOSPITAL_COMMUNITY): Payer: Self-pay | Admitting: Pharmacy Technician

## 2012-01-30 ENCOUNTER — Emergency Department (HOSPITAL_COMMUNITY)
Admission: EM | Admit: 2012-01-30 | Discharge: 2012-01-30 | Disposition: A | Discharge status: Home / Self Care | Payer: 59 | Attending: Emergency Medicine | Admitting: Emergency Medicine

## 2012-01-30 ENCOUNTER — Ambulatory Visit: Payer: 59 | Admitting: Internal Medicine

## 2012-01-30 ENCOUNTER — Encounter (HOSPITAL_COMMUNITY): Payer: Self-pay | Admitting: *Deleted

## 2012-01-30 DIAGNOSIS — M5126 Other intervertebral disc displacement, lumbar region: Secondary | ICD-10-CM | POA: Insufficient documentation

## 2012-01-30 DIAGNOSIS — IMO0002 Reserved for concepts with insufficient information to code with codable children: Secondary | ICD-10-CM | POA: Insufficient documentation

## 2012-01-30 DIAGNOSIS — G8929 Other chronic pain: Secondary | ICD-10-CM

## 2012-01-30 DIAGNOSIS — M79609 Pain in unspecified limb: Secondary | ICD-10-CM | POA: Insufficient documentation

## 2012-01-30 DIAGNOSIS — Z7982 Long term (current) use of aspirin: Secondary | ICD-10-CM | POA: Insufficient documentation

## 2012-01-30 DIAGNOSIS — R262 Difficulty in walking, not elsewhere classified: Secondary | ICD-10-CM | POA: Insufficient documentation

## 2012-01-30 DIAGNOSIS — R209 Unspecified disturbances of skin sensation: Secondary | ICD-10-CM | POA: Insufficient documentation

## 2012-01-30 DIAGNOSIS — Z79899 Other long term (current) drug therapy: Secondary | ICD-10-CM | POA: Insufficient documentation

## 2012-01-30 DIAGNOSIS — M5416 Radiculopathy, lumbar region: Secondary | ICD-10-CM

## 2012-01-30 MED ORDER — OXYCODONE-ACETAMINOPHEN 10-325 MG PO TABS: 1.0000 | ORAL_TABLET | ORAL | Status: DC | PRN

## 2012-01-30 MED ORDER — HYDROMORPHONE HCL PF 1 MG/ML IJ SOLN
1.0000 mg | Freq: Once | INTRAMUSCULAR | Status: AC
  Administered 2012-01-30: 1 mg via INTRAMUSCULAR
  Filled 2012-01-30: qty 1

## 2012-01-30 MED ORDER — HYDROMORPHONE HCL PF 2 MG/ML IJ SOLN
2.0000 mg | Freq: Once | INTRAMUSCULAR | Status: AC
  Administered 2012-01-30: 2 mg via INTRAMUSCULAR
  Filled 2012-01-30: qty 1

## 2012-01-30 MED ORDER — DIAZEPAM 5 MG PO TABS: 5.0000 mg | ORAL_TABLET | Freq: Two times a day (BID) | ORAL | Status: DC

## 2012-01-30 MED ORDER — DIAZEPAM 5 MG/ML IJ SOLN
5.0000 mg | Freq: Once | INTRAMUSCULAR | Status: AC
  Administered 2012-01-30: 5 mg via INTRAMUSCULAR
  Filled 2012-01-30: qty 2

## 2012-01-30 NOTE — ED Notes (Signed)
Patient transported to MRI via WC

## 2012-01-30 NOTE — ED Notes (Signed)
Pt in c/o left leg pain and numbness, pain starts in hip area, states that she has been seen at urgent care for same and is trying to schedule an MRI, increased pain tonight

## 2012-01-30 NOTE — ED Provider Notes (Signed)
History     CSN: [id number]  Arrival date & time 01/30/12  1725   First MD Initiated Contact with Patient 01/30/12 1746      Chief Complaint  Patient presents with  . Leg Pain    (Consider location/radiation/quality/duration/timing/severity/associated sxs/prior treatment) HPI Comments: Patient here with left lower back pain with radiation to the left leg - states this started on Tuesday - no previous history of same - reports seen at [facility] and was given medicaiton - returned yesterday and scheduled for MRI on Sunday - states pain increased today and now with numbness from left knee to foot - denies loss of control of bowels or bladder, reports subjective weakness to left leg as well which is new.  Patient is a 41 y.o. female presenting with leg pain. The history is provided by the patient. No language interpreter was used.  Leg Pain  The incident occurred more than 2 days ago. The incident occurred at home. There was no injury mechanism. The pain is present in the left hip. The quality of the pain is described as burning, throbbing and tingling. The pain is at a severity of 10/10. The pain is severe. The pain has been constant since onset. Associated symptoms include numbness, loss of sensation and tingling. Pertinent negatives include no inability to bear weight, no loss of motion and no muscle weakness. She reports no foreign bodies present. The symptoms are aggravated by bearing weight and activity. She has tried nothing for the symptoms. The treatment provided no relief.    Past Medical History  Diagnosis Date  . PONV (postoperative nausea and vomiting)   . Pinched nerve     left side-leg    Past Surgical History  Procedure Date  . Cyst on throat     at age 46  . Cesarean section 94/97/03    Family History  Problem Relation Age of Onset  . Anesthesia problems Mother     History  Substance Use Topics  . Smoking status: Never Smoker   . Smokeless tobacco: Not on file  .  Alcohol Use: No    OB History    Grav Para Term Preterm Abortions TAB SAB Ect Mult Living                  Review of Systems  Genitourinary: Negative for dysuria, urgency, decreased urine volume, enuresis and difficulty urinating.  Musculoskeletal: Positive for myalgias, back pain and gait problem.  Neurological: Positive for tingling and numbness.  All other systems reviewed and are negative.    Allergies  Morphine and related  Home Medications   Current Outpatient Rx  Name Route Sig Dispense Refill  . ASPIRIN 325 MG PO TABS Oral Take 325 mg by mouth daily.    . CYCLOBENZAPRINE HCL 5 MG PO TABS Oral Take 1 tablet (5 mg total) by mouth 3 (three) times daily as needed for muscle spasms. 30 tablet 0  . GABAPENTIN 300 MG PO CAPS  1-2 tabs po qhs 60 capsule 3  . NAPROXEN 500 MG PO TABS Oral Take 1 tablet (500 mg total) by mouth 2 (two) times daily with a meal. 30 tablet 0  . OXYCODONE HCL (ABUSE DETER) PO Oral Take by mouth.    . OXYCODONE-ACETAMINOPHEN 10-325 MG PO TABS Oral Take 1 tablet by mouth every 4 (four) hours as needed for pain. 20 tablet 0  . PREDNISONE 20 MG PO TABS  3 po for 2 days, 2 po for 2 days, 1  po for 2 days 12 tablet 0    BP 158/96  Pulse 98  Temp(Src) 98.9 F (37.2 C) (Oral)  Resp 16  SpO2 100%  LMP 01/29/2012  Physical Exam  Nursing note and vitals reviewed. Constitutional: She is oriented to person, place, and time. She appears well-developed and well-nourished. She appears distressed.  HENT:  Head: Normocephalic and atraumatic.  Right Ear: External ear normal.  Left Ear: External ear normal.  Nose: Nose normal.  Mouth/Throat: Oropharynx is clear and moist. No oropharyngeal exudate.  Eyes: Conjunctivae are normal. Pupils are equal, round, and reactive to light. No scleral icterus.  Neck: Normal range of motion. Neck supple.  Cardiovascular: Normal rate and regular rhythm.  Exam reveals no gallop and no friction rub.   No murmur  heard. Pulmonary/Chest: Effort normal and breath sounds normal. No respiratory distress. She exhibits no tenderness.  Abdominal: Soft. Bowel sounds are normal. She exhibits no distension. There is no tenderness.  Musculoskeletal:       Lumbar back: She exhibits decreased range of motion, tenderness, pain and spasm. She exhibits no bony tenderness and no edema.  Lymphadenopathy:    She has no cervical adenopathy.  Neurological: She is alert and oriented to person, place, and time. She has normal reflexes. No cranial nerve deficit. She exhibits normal muscle tone. Coordination normal.  Skin: Skin is warm and dry. No rash noted. No erythema. No pallor.  Psychiatric: She has a normal mood and affect. Her behavior is normal. Judgment and thought content normal.    ED Course  Procedures (including critical care time)  Labs Reviewed - No data to display No results found.  Results for orders placed during the hospital encounter of 01/26/12  APTT      Component Value Range   aPTT 28  24 - 37 (seconds)  BASIC METABOLIC PANEL      Component Value Range   Sodium 139  135 - 145 (mEq/L)   Potassium 4.2  3.5 - 5.1 (mEq/L)   Chloride 104  96 - 112 (mEq/L)   CO2 24  19 - 32 (mEq/L)   Glucose, Bld 78  70 - 99 (mg/dL)   BUN 9  6 - 23 (mg/dL)   Creatinine, Ser 1.61  0.50 - 1.10 (mg/dL)   Calcium 9.1  8.4 - 09.6 (mg/dL)   GFR calc non Af Amer >90  >90 (mL/min)   GFR calc Af Amer >90  >90 (mL/min)  CBC      Component Value Range   WBC 8.8  4.0 - 10.5 (K/uL)   RBC 4.82  3.87 - 5.11 (MIL/uL)   Hemoglobin 14.4  12.0 - 15.0 (g/dL)   HCT 04.5  40.9 - 81.1 (%)   MCV 88.2  78.0 - 100.0 (fL)   MCH 29.9  26.0 - 34.0 (pg)   MCHC 33.9  30.0 - 36.0 (g/dL)   RDW 91.4  78.2 - 95.6 (%)   Platelets 320  150 - 400 (K/uL)  DIFFERENTIAL      Component Value Range   Neutrophils Relative 67  43 - 77 (%)   Neutro Abs 5.9  1.7 - 7.7 (K/uL)   Lymphocytes Relative 25  12 - 46 (%)   Lymphs Abs 2.2  0.7 - 4.0  (K/uL)   Monocytes Relative 6  3 - 12 (%)   Monocytes Absolute 0.6  0.1 - 1.0 (K/uL)   Eosinophils Relative 1  0 - 5 (%)   Eosinophils Absolute 0.1  0.0 -  0.7 (K/uL)   Basophils Relative 1  0 - 1 (%)   Basophils Absolute 0.1  0.0 - 0.1 (K/uL)  PROTIME-INR      Component Value Range   Prothrombin Time 13.8  11.6 - 15.2 (seconds)   INR 1.04  0.00 - 1.49   SURGICAL PCR SCREEN      Component Value Range   MRSA, PCR NEGATIVE  NEGATIVE    Staphylococcus aureus NEGATIVE  NEGATIVE   HCG, SERUM, QUALITATIVE      Component Value Range   Preg, Serum NEGATIVE  NEGATIVE   PLATELET INHIBITION P2Y12      Component Value Range   Platelet Function  P2Y12 171 (*) 194 - 418 (PRU)   Dg Chest 2 View  01/19/2012  *RADIOLOGY REPORT*  Clinical Data: Preop  CHEST - 2 VIEW  Comparison: None.  Findings: The heart is normal in size.  Mild bronchitic changes. No peripheral consolidation, pneumothorax, pleural effusion.  IMPRESSION: Mild bronchitic changes.  Original Report Authenticated By: Donavan Burnet, M.D.   Mr Lumbar Spine Wo Contrast  01/30/2012  *RADIOLOGY REPORT*  Clinical Data: Low back and left leg pain.  MRI LUMBAR SPINE WITHOUT CONTRAST  Technique:  Multiplanar and multiecho pulse sequences of the lumbar spine were obtained without intravenous contrast.  Comparison: None.  Findings: The sagittal MR images demonstrate normal alignment of the lumbar vertebral bodies.  They demonstrate normal marrow signal.  The last full intervertebral disc space is labeled L5-S1 and the conus medullaris terminates at the bottom of T12.  The facets are normally aligned.  No pars defects.  No significant paraspinal or retroperitoneal process.  No significant findings at L1-2, L2-3 or L3-4.  L4-5:  There is a moderate sized broad-based disc protrusion with mass effect on the thecal sac and possible compression/irritation of both L5 nerve roots in the lateral recess.  No foraminal stenosis.  L5-S1:  No significant findings.   IMPRESSION: Moderate sized broad-based disc protrusion at L4-5.  Original Report Authenticated By: P. Loralie Champagne, M.D.   Ir Radiologist Eval & Mgmt  01/07/2012  *RADIOLOGY REPORT*  Clinical Data: 41 year old female with pulsatile tinnitus in right ear.  CONSULTATION: The patient and husband are here today to be evaluated by Dr. Corliss Skains.  The patient states she has had pulsation and whooshing sound in right ear for approximately 6 months.  She denies any trauma to her head or ear.  She denies headaches, visual problems, or blurred vision.  She has had no recent ear infection.  Only other symptoms patient having is left leg pain for last few months although she feels this is unrelated.  Past medical history:  None   Surgeries:  C-section x three; neck surgery at age 26- unknown problem.  Allergies:  None although morphine causes nausea and vomiting.  Medications:  None; the patient does use IUD for contraception  Social history:  The patient is married and has three children. [place] and family. She is a nonsmoker and nondrinker.  She is a Paramedic at [employer].  Family history:  Mother is 58 years old alive and well; father 58 years old, alive and well; she has one sister 23 years old alive and well.  IMPRESSION: Dr. Corliss Skains spends approximately 30 minutes with this patient and her husband.  He reviews recent MRI MRA and MRV.  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 He discusses with the  patient implications of this stenosis and recommends cerebral arteriogram and possible angioplasty and/or stent of right transverse sinus.  All questions were answered to satisfaction.  Risks benefits of the procedure were discussed.  The patient and her husband agree to move forward with procedure.  This has been scheduled for January 26, 2012.  The patient is to start taking 81 mg aspirin today and increase water intake.   Prescription was given to patient for Plavix 75 mg #30.  She is to start taking this medication 4 days prior to procedure.  At that time she is to increase the aspirin to 325 mg daily.  They leave here today with good understanding of this plan.  Read by: Ralene Muskrat, P.A.-C  Original Report Authenticated By: Oneal Grout, M.D.   Ir Angio Vertebral Sel Vertebral Bilat Mod Sed  01/26/2012  *RADIOLOGY REPORT*  Clinical Data: Patient with right-sided pulsatile tinnitus with mastoid fullness.  Abnormal MRV examination of the brain.  Comparison: MRI of the brain and MRV of the brain of 12/29/2011.  Following full explanation of the procedure along with the potential associated complications, an informed witnessed consent was obtained.  The right groin was prepped and draped in the usual sterile fashion.  Thereafter using modified Seldinger technique, transfemoral access into the right common femoral artery was obtained without difficulty.  Over a 0.035-inch guidewire, a 5- [place] Pinnacle sheath was inserted.  Through this and also over a 0.035 inch guidewire, a 5-French JB1 catheter was advanced to the aortic arch region and selectively positioned in the right common carotid artery, the right vertebral artery, the left common carotid artery and the left vertebral artery.  There were no acute complications.  The patient tolerated the procedure well.  Medications utilized:  Versed 2 mg IV.  Fentanyl 100 mcg IV.  Contrast: Omnipaque-300 approximately 60 mL.  Findings:  The right vertebral artery origin is normal.  The vessel opacifies normally to the cranial skull base.  There is normal opacification of right vertebrobasilar junction of the right posterior-inferior cerebellar artery.  The basilar artery, the posterior cerebral arteries, the superior cerebellar arteries and the anterior-inferior cerebral arteries opacify normally into the venous phase.  The venous phase demonstrates a focal narrowing of the  junction of the right transverse sinus with the right sigmoid sinus.  This measures approximately 70-75% in the lateral projection and approximately 50 -60% in the AP projection.  Also demonstrated is a hypoplastic left transverse sinus with inflow from the left vein of  Galen.  The subsequent outflow into the sigmoid sinuses and the internal jugular veins is normal.  The right common carotid arteriogram demonstrates the right external carotid artery and its major branches to be normal.  The right internal carotid artery at the bulb to the cranial skull base is normal.  The petrous, the cavernous and the supraclinoid segments are normal.  The right middle and the right anterior cerebral arteries opacify normally into the capillary and venous phases.  The venous phase again demonstrates the focal stenosis at the junction of the right transverse sinus with the right sigmoid sinus.  Also demonstrated is a opacification via the left occipital sinus.  The left common carotid arteriogram demonstrates left external carotid artery and its major branches to be normal.  The left internal carotid at the bulb to the cranial skull base is normal.  The petrous, the cavernous and the supraclinoid segments are normal.  Left middle and the left anterior cerebral arteries opacify  normally into capillary and venous phases.  _____________(NOTE)THE DICTATION STARTS SKIPPING AROUND AT THIS POIN(where the blanks are)  Hypoplastic left transverse sinus is noted with primarily venous egress via the right transverse sinus, and also the left vein of Labbe.  The left vertebral artery origin is normal.  The vessel opacifies normally to the cranial skull base.  There is normal opacification of the left posterior-inferior cerebellar and the left vertebrobasilar junction.  The basilar artery, the post cerebral arteries, superior cerebellar arteries and the anterior-inferior cerebellar arteries is normal, with exception of the year stenosis of the  junction of the right transverse sinus with the right sigmoid sinus as described earlier  There is no angiographic evidence of retrograde ___________________opacification into the venous system.  Impression 1.  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 2. Developmentally hypoplastic left transverse sinus.  The angiographic findings were discussed with the patient and the patient's husband.  The patient reports no new symptoms of headaches, nausea vomiting, visual operations, blindness, diplopia or retro-orbital pain, or, contralateral _________.  In view __________of this and also angiographic findings it was felt to maintain a conservative management plan. The patient has been asked to continue taking aspirin through 25 mg a day, and grain plenty of plain water.  A follow-up arteriogram will be undertaken in approximately 4 months, or sooner should the patient develop any new symptoms or changes  in the  present symptoms.  Original Report Authenticated By: Oneal Grout, M.D.     1. Lumbar radiculopathy   2. Ongoing leg pain   L4-5 Disc protrusion     MDM  Patient with history of left lumbar back pain c/w sciatica - has MRI scheduled for Sunday - they are able to do this today so we will get this - pain medication given.   Patient with moderate broad based disc protrusion at L4-5 - pain a little better under control.  Will continue pain medication and refer the patient to Dr. Jordan Likes.    Izola Price [place], [place] 01/30/12 1950

## 2012-01-31 NOTE — ED Provider Notes (Signed)
Medical screening examination/treatment/procedure(s) were performed by non-physician practitioner and as supervising physician I was immediately available for consultation/collaboration.   Forbes Cellar, MD 01/31/12 [phone]

## 2012-02-01 ENCOUNTER — Other Ambulatory Visit (HOSPITAL_COMMUNITY): Payer: 59

## 2012-02-04 ENCOUNTER — Encounter (HOSPITAL_COMMUNITY): Payer: Self-pay | Admitting: Pharmacy Technician

## 2012-02-04 ENCOUNTER — Other Ambulatory Visit: Payer: 59

## 2012-02-09 ENCOUNTER — Encounter (HOSPITAL_COMMUNITY)
Admission: RE | Admit: 2012-02-09 | Discharge: 2012-02-09 | Disposition: A | Discharge status: Home / Self Care | Payer: 59 | Source: Ambulatory Visit | Attending: Neurosurgery | Admitting: Neurosurgery

## 2012-02-09 ENCOUNTER — Encounter (HOSPITAL_COMMUNITY): Payer: Self-pay

## 2012-02-09 LAB — TYPE AND SCREEN

## 2012-02-09 MED ORDER — CEFAZOLIN SODIUM-DEXTROSE 2-3 GM-% IV SOLR
2.0000 g | INTRAVENOUS | Status: AC
  Administered 2012-02-10: 2 g via INTRAVENOUS
  Filled 2012-02-09: qty 50

## 2012-02-09 MED ORDER — DEXAMETHASONE SODIUM PHOSPHATE 10 MG/ML IJ SOLN
10.0000 mg | Freq: Once | INTRAMUSCULAR | Status: AC
  Administered 2012-02-10: 10 mg via INTRAVENOUS
  Filled 2012-02-09 (×2): qty 1

## 2012-02-09 NOTE — Pre-Procedure Instructions (Signed)
20 Stephanie [name]  02/09/2012   Your procedure is scheduled on: feb 19 Report to [facility] at 0530 AM.  Call this number if you have problems the morning of surgery: [phone]   Remember:   Do not eat food:After Midnight.  May have clear liquids: up to 4 Hours before arrival.  Clear liquids include soda, tea, black coffee, apple or grape juice, broth.  Take these medicines the morning of surgery with A SIP OF WATER: percocet   Do not wear jewelry, make-up or nail polish.  Do not wear lotions, powders, or perfumes. You may wear deodorant.  Do not shave 48 hours prior to surgery.  Do not bring valuables to the hospital.  Contacts, dentures or bridgework may not be worn into surgery.  Leave suitcase in the car. After surgery it may be brought to your room.  For patients admitted to the hospital, checkout time is 11:00 AM the day of discharge.   Patients discharged the day of surgery will not be allowed to drive home.  Name and phone number of your driver: spouse  Special Instructions: CHG Shower Use Special Wash: 1/2 bottle night before surgery and 1/2 bottle morning of surgery.   Please read over the following fact sheets that you were given: Blood Transfusion Information, MRSA Information and Surgical Site Infection Prevention

## 2012-02-09 NOTE — Progress Notes (Addendum)
Do not repeat labs for planned procedure 02/10/12.results from 12/2811 acceptable per dr Gypsy Balsam

## 2012-02-10 ENCOUNTER — Ambulatory Visit (HOSPITAL_COMMUNITY): Payer: 59 | Admitting: Anesthesiology

## 2012-02-10 ENCOUNTER — Ambulatory Visit (HOSPITAL_COMMUNITY): Payer: 59

## 2012-02-10 ENCOUNTER — Encounter (HOSPITAL_COMMUNITY): Payer: Self-pay | Admitting: Anesthesiology

## 2012-02-10 ENCOUNTER — Encounter (HOSPITAL_COMMUNITY)
Admission: RE | Disposition: A | Discharge status: Home / Self Care | Payer: Self-pay | Source: Ambulatory Visit | Attending: Neurosurgery

## 2012-02-10 ENCOUNTER — Encounter (HOSPITAL_COMMUNITY): Payer: Self-pay | Admitting: *Deleted

## 2012-02-10 ENCOUNTER — Ambulatory Visit (HOSPITAL_COMMUNITY)
Admission: RE | Admit: 2012-02-10 | Discharge: 2012-02-11 | DRG: 491 | Disposition: A | Discharge status: Home / Self Care | Payer: 59 | Source: Ambulatory Visit | Attending: Neurosurgery | Admitting: Neurosurgery

## 2012-02-10 DIAGNOSIS — Z7982 Long term (current) use of aspirin: Secondary | ICD-10-CM

## 2012-02-10 DIAGNOSIS — M5126 Other intervertebral disc displacement, lumbar region: Secondary | ICD-10-CM | POA: Diagnosis present

## 2012-02-10 DIAGNOSIS — M5116 Intervertebral disc disorders with radiculopathy, lumbar region: Secondary | ICD-10-CM | POA: Diagnosis present

## 2012-02-10 DIAGNOSIS — Z01812 Encounter for preprocedural laboratory examination: Secondary | ICD-10-CM | POA: Insufficient documentation

## 2012-02-10 HISTORY — PX: LUMBAR LAMINECTOMY/DECOMPRESSION MICRODISCECTOMY: SHX5026

## 2012-02-10 SURGERY — LUMBAR LAMINECTOMY/DECOMPRESSION MICRODISCECTOMY 1 LEVEL
Anesthesia: General | Laterality: Left | Wound class: Clean

## 2012-02-10 MED ORDER — THROMBIN 5000 UNITS EX KIT
PACK | CUTANEOUS | Status: DC | PRN
  Administered 2012-02-10: 5000 [IU] via TOPICAL

## 2012-02-10 MED ORDER — BISACODYL 10 MG RE SUPP: 10.0000 mg | Freq: Every day | RECTAL | Status: DC | PRN

## 2012-02-10 MED ORDER — ACETAMINOPHEN 650 MG RE SUPP: 650.0000 mg | RECTAL | Status: DC | PRN

## 2012-02-10 MED ORDER — ACETAMINOPHEN 325 MG PO TABS
650.0000 mg | ORAL_TABLET | ORAL | Status: DC | PRN
  Administered 2012-02-11: 650 mg via ORAL
  Filled 2012-02-10: qty 2

## 2012-02-10 MED ORDER — SODIUM CHLORIDE 0.9 % IJ SOLN
3.0000 mL | Freq: Two times a day (BID) | INTRAMUSCULAR | Status: DC
  Administered 2012-02-10 (×2): 3 mL via INTRAVENOUS

## 2012-02-10 MED ORDER — HYDROMORPHONE HCL PF 1 MG/ML IJ SOLN
INTRAMUSCULAR | Status: AC
  Filled 2012-02-10: qty 1

## 2012-02-10 MED ORDER — SENNA 8.6 MG PO TABS
1.0000 | ORAL_TABLET | Freq: Two times a day (BID) | ORAL | Status: DC
  Administered 2012-02-10 – 2012-02-11 (×2): 8.6 mg via ORAL
  Filled 2012-02-10 (×3): qty 1

## 2012-02-10 MED ORDER — 0.9 % SODIUM CHLORIDE (POUR BTL) OPTIME
TOPICAL | Status: DC | PRN
  Administered 2012-02-10: 1000 mL

## 2012-02-10 MED ORDER — BUPIVACAINE HCL (PF) 0.25 % IJ SOLN
INTRAMUSCULAR | Status: DC | PRN
  Administered 2012-02-10: 20 mL

## 2012-02-10 MED ORDER — HYDROMORPHONE HCL PF 1 MG/ML IJ SOLN
0.2500 mg | INTRAMUSCULAR | Status: DC | PRN
  Administered 2012-02-10 (×3): 0.25 mg via INTRAVENOUS

## 2012-02-10 MED ORDER — PROMETHAZINE HCL 25 MG/ML IJ SOLN: 6.2500 mg | INTRAMUSCULAR | Status: DC | PRN

## 2012-02-10 MED ORDER — FLEET ENEMA 7-19 GM/118ML RE ENEM
1.0000 | ENEMA | Freq: Once | RECTAL | Status: AC | PRN
  Filled 2012-02-10: qty 1

## 2012-02-10 MED ORDER — FENTANYL CITRATE 0.05 MG/ML IJ SOLN
INTRAMUSCULAR | Status: DC | PRN
  Administered 2012-02-10 (×3): 50 ug via INTRAVENOUS
  Administered 2012-02-10: 100 ug via INTRAVENOUS

## 2012-02-10 MED ORDER — BACITRACIN 50000 UNITS IM SOLR
INTRAMUSCULAR | Status: AC
  Filled 2012-02-10: qty 1

## 2012-02-10 MED ORDER — KETOROLAC TROMETHAMINE 30 MG/ML IJ SOLN
30.0000 mg | Freq: Four times a day (QID) | INTRAMUSCULAR | Status: DC
  Administered 2012-02-10 – 2012-02-11 (×3): 30 mg via INTRAVENOUS
  Filled 2012-02-10 (×7): qty 1

## 2012-02-10 MED ORDER — ASPIRIN 325 MG PO TABS
325.0000 mg | ORAL_TABLET | Freq: Every day | ORAL | Status: DC
  Administered 2012-02-10 – 2012-02-11 (×2): 325 mg via ORAL
  Filled 2012-02-10 (×2): qty 1

## 2012-02-10 MED ORDER — ONDANSETRON HCL 4 MG/2ML IJ SOLN
INTRAMUSCULAR | Status: DC | PRN
  Administered 2012-02-10: 4 mg via INTRAVENOUS

## 2012-02-10 MED ORDER — ONDANSETRON HCL 4 MG/2ML IJ SOLN: 4.0000 mg | INTRAMUSCULAR | Status: DC | PRN

## 2012-02-10 MED ORDER — SODIUM CHLORIDE 0.9 % IV SOLN
INTRAVENOUS | Status: AC
  Filled 2012-02-10: qty 500

## 2012-02-10 MED ORDER — THROMBIN 5000 UNITS EX SOLR
CUTANEOUS | Status: DC | PRN
  Administered 2012-02-10: 5000 [IU] via TOPICAL

## 2012-02-10 MED ORDER — MENTHOL 3 MG MT LOZG
1.0000 | LOZENGE | OROMUCOSAL | Status: DC | PRN
  Filled 2012-02-10: qty 9

## 2012-02-10 MED ORDER — HYDROCODONE-ACETAMINOPHEN 5-325 MG PO TABS: 1.0000 | ORAL_TABLET | ORAL | Status: DC | PRN

## 2012-02-10 MED ORDER — ZOLPIDEM TARTRATE 5 MG PO TABS: 5.0000 mg | ORAL_TABLET | Freq: Every evening | ORAL | Status: DC | PRN

## 2012-02-10 MED ORDER — POLYETHYLENE GLYCOL 3350 17 G PO PACK
17.0000 g | PACK | Freq: Every day | ORAL | Status: DC | PRN
  Filled 2012-02-10: qty 1

## 2012-02-10 MED ORDER — PROPOFOL 10 MG/ML IV EMUL
INTRAVENOUS | Status: DC | PRN
  Administered 2012-02-10: 180 mg via INTRAVENOUS

## 2012-02-10 MED ORDER — ROCURONIUM BROMIDE 100 MG/10ML IV SOLN
INTRAVENOUS | Status: DC | PRN
  Administered 2012-02-10: 50 mg via INTRAVENOUS

## 2012-02-10 MED ORDER — HYDROMORPHONE HCL PF 1 MG/ML IJ SOLN
0.5000 mg | INTRAMUSCULAR | Status: DC | PRN
  Administered 2012-02-10: 1 mg via INTRAVENOUS
  Filled 2012-02-10: qty 1

## 2012-02-10 MED ORDER — CYCLOBENZAPRINE HCL 10 MG PO TABS: 10.0000 mg | ORAL_TABLET | Freq: Three times a day (TID) | ORAL | Status: DC | PRN

## 2012-02-10 MED ORDER — MIDAZOLAM HCL 5 MG/5ML IJ SOLN
INTRAMUSCULAR | Status: DC | PRN
  Administered 2012-02-10: 2 mg via INTRAVENOUS

## 2012-02-10 MED ORDER — SODIUM CHLORIDE 0.9 % IJ SOLN: 3.0000 mL | INTRAMUSCULAR | Status: DC | PRN

## 2012-02-10 MED ORDER — NEOSTIGMINE METHYLSULFATE 1 MG/ML IJ SOLN
INTRAMUSCULAR | Status: DC | PRN
  Administered 2012-02-10: 4 mg via INTRAVENOUS

## 2012-02-10 MED ORDER — LACTATED RINGERS IV SOLN
INTRAVENOUS | Status: DC | PRN
  Administered 2012-02-10 (×4): via INTRAVENOUS

## 2012-02-10 MED ORDER — GABAPENTIN 300 MG PO CAPS
300.0000 mg | ORAL_CAPSULE | Freq: Every evening | ORAL | Status: DC | PRN
  Filled 2012-02-10: qty 2

## 2012-02-10 MED ORDER — GLYCOPYRROLATE 0.2 MG/ML IJ SOLN
INTRAMUSCULAR | Status: DC | PRN
  Administered 2012-02-10: .6 mg via INTRAVENOUS

## 2012-02-10 MED ORDER — HEMOSTATIC AGENTS (NO CHARGE) OPTIME
TOPICAL | Status: DC | PRN
  Administered 2012-02-10: 1 via TOPICAL

## 2012-02-10 MED ORDER — ALUM & MAG HYDROXIDE-SIMETH 200-200-20 MG/5ML PO SUSP: 30.0000 mL | Freq: Four times a day (QID) | ORAL | Status: DC | PRN

## 2012-02-10 MED ORDER — LORAZEPAM 2 MG/ML IJ SOLN: 1.0000 mg | Freq: Once | INTRAMUSCULAR | Status: DC | PRN

## 2012-02-10 MED ORDER — DIAZEPAM 5 MG PO TABS
5.0000 mg | ORAL_TABLET | Freq: Two times a day (BID) | ORAL | Status: DC
  Administered 2012-02-10 – 2012-02-11 (×3): 5 mg via ORAL
  Filled 2012-02-10 (×3): qty 1

## 2012-02-10 MED ORDER — PHENOL 1.4 % MT LIQD: 1.0000 | OROMUCOSAL | Status: DC | PRN

## 2012-02-10 MED ORDER — KETOROLAC TROMETHAMINE 30 MG/ML IJ SOLN
INTRAMUSCULAR | Status: DC | PRN
  Administered 2012-02-10: 30 mg via INTRAVENOUS

## 2012-02-10 MED ORDER — SODIUM CHLORIDE 0.9 % IR SOLN
Status: DC | PRN
  Administered 2012-02-10: 09:00:00

## 2012-02-10 MED ORDER — SODIUM CHLORIDE 0.9 % IV SOLN
250.0000 mL | INTRAVENOUS | Status: DC
  Administered 2012-02-10: 250 mL via INTRAVENOUS

## 2012-02-10 MED ORDER — CEFAZOLIN SODIUM 1-5 GM-% IV SOLN
1.0000 g | Freq: Three times a day (TID) | INTRAVENOUS | Status: AC
  Administered 2012-02-10 (×2): 1 g via INTRAVENOUS
  Filled 2012-02-10 (×2): qty 50

## 2012-02-10 MED ORDER — OXYCODONE-ACETAMINOPHEN 5-325 MG PO TABS
1.0000 | ORAL_TABLET | ORAL | Status: DC | PRN
  Administered 2012-02-10: 2 via ORAL
  Filled 2012-02-10: qty 2

## 2012-02-10 SURGICAL SUPPLY — 51 items
ADH SKN CLS APL DERMABOND .7 (GAUZE/BANDAGES/DRESSINGS) ×1
APL SKNCLS STERI-STRIP NONHPOA (GAUZE/BANDAGES/DRESSINGS) ×1
BAG DECANTER FOR FLEXI CONT (MISCELLANEOUS) ×2 IMPLANT
BENZOIN TINCTURE PRP APPL 2/3 (GAUZE/BANDAGES/DRESSINGS) ×2 IMPLANT
BLADE SURG ROTATE 9660 (MISCELLANEOUS) IMPLANT
BRUSH SCRUB EZ PLAIN DRY (MISCELLANEOUS) ×2 IMPLANT
BUR CUTTER 7.0 ROUND (BURR) ×2 IMPLANT
CANISTER SUCTION 2500CC (MISCELLANEOUS) ×2 IMPLANT
CLOTH BEACON ORANGE TIMEOUT ST (SAFETY) ×2 IMPLANT
CONT SPEC 4OZ CLIKSEAL STRL BL (MISCELLANEOUS) ×2 IMPLANT
DECANTER SPIKE VIAL GLASS SM (MISCELLANEOUS) ×2 IMPLANT
DERMABOND ADVANCED (GAUZE/BANDAGES/DRESSINGS) ×1
DERMABOND ADVANCED .7 DNX12 (GAUZE/BANDAGES/DRESSINGS) ×1 IMPLANT
DRAPE LAPAROTOMY 100X72X124 (DRAPES) ×2 IMPLANT
DRAPE MICROSCOPE ZEISS OPMI (DRAPES) ×2 IMPLANT
DRAPE POUCH INSTRU U-SHP 10X18 (DRAPES) ×2 IMPLANT
DRAPE PROXIMA HALF (DRAPES) IMPLANT
DRAPE SURG 17X23 STRL (DRAPES) ×4 IMPLANT
ELECT REM PT RETURN 9FT ADLT (ELECTROSURGICAL) ×2
ELECTRODE REM PT RTRN 9FT ADLT (ELECTROSURGICAL) ×1 IMPLANT
GAUZE SPONGE 4X4 16PLY XRAY LF (GAUZE/BANDAGES/DRESSINGS) IMPLANT
GLOVE BIOGEL M 8.0 STRL (GLOVE) ×1 IMPLANT
GLOVE BIOGEL PI IND STRL 7.0 (GLOVE) IMPLANT
GLOVE BIOGEL PI INDICATOR 7.0 (GLOVE) ×1
GLOVE ECLIPSE 8.5 STRL (GLOVE) ×2 IMPLANT
GLOVE EXAM NITRILE LRG STRL (GLOVE) IMPLANT
GLOVE EXAM NITRILE MD LF STRL (GLOVE) ×1 IMPLANT
GLOVE EXAM NITRILE XL STR (GLOVE) IMPLANT
GLOVE EXAM NITRILE XS STR PU (GLOVE) IMPLANT
GLOVE SURG SS PI 6.5 STRL IVOR (GLOVE) ×2 IMPLANT
GOWN BRE IMP SLV AUR LG STRL (GOWN DISPOSABLE) ×1 IMPLANT
GOWN BRE IMP SLV AUR XL STRL (GOWN DISPOSABLE) ×4 IMPLANT
GOWN STRL REIN 2XL LVL4 (GOWN DISPOSABLE) IMPLANT
KIT BASIN OR (CUSTOM PROCEDURE TRAY) ×2 IMPLANT
KIT ROOM TURNOVER OR (KITS) ×2 IMPLANT
NDL SPNL 22GX3.5 QUINCKE BK (NEEDLE) ×1 IMPLANT
NEEDLE HYPO 22GX1.5 SAFETY (NEEDLE) ×2 IMPLANT
NEEDLE SPNL 22GX3.5 QUINCKE BK (NEEDLE) ×2 IMPLANT
NS IRRIG 1000ML POUR BTL (IV SOLUTION) ×2 IMPLANT
PACK LAMINECTOMY NEURO (CUSTOM PROCEDURE TRAY) ×2 IMPLANT
PAD ARMBOARD 7.5X6 YLW CONV (MISCELLANEOUS) ×6 IMPLANT
RUBBERBAND STERILE (MISCELLANEOUS) ×4 IMPLANT
SPONGE GAUZE 4X4 12PLY (GAUZE/BANDAGES/DRESSINGS) ×1 IMPLANT
SPONGE SURGIFOAM ABS GEL SZ50 (HEMOSTASIS) ×2 IMPLANT
STRIP CLOSURE SKIN 1/2X4 (GAUZE/BANDAGES/DRESSINGS) ×2 IMPLANT
SUT VIC AB 2-0 CT1 18 (SUTURE) ×2 IMPLANT
SUT VIC AB 3-0 SH 8-18 (SUTURE) ×2 IMPLANT
SYR 20ML ECCENTRIC (SYRINGE) ×2 IMPLANT
TOWEL OR 17X24 6PK STRL BLUE (TOWEL DISPOSABLE) ×2 IMPLANT
TOWEL OR 17X26 10 PK STRL BLUE (TOWEL DISPOSABLE) ×2 IMPLANT
WATER STERILE IRR 1000ML POUR (IV SOLUTION) ×2 IMPLANT

## 2012-02-10 NOTE — Anesthesia Preprocedure Evaluation (Signed)
Anesthesia Evaluation  Patient identified by MRN, date of birth, ID band Patient awake    Reviewed: Allergy & Precautions, H&P , NPO status , Patient's Chart, lab work & pertinent test results  History of Anesthesia Complications (+) PONV  Airway Mallampati: I TM Distance: >3 FB Neck ROM: Full    Dental   Pulmonary    Pulmonary exam normal       Cardiovascular hypertension,     Neuro/Psych    GI/Hepatic   Endo/Other  Morbid obesity  Renal/GU      Musculoskeletal   Abdominal (+) obese,   Peds  Hematology   Anesthesia Other Findings   Reproductive/Obstetrics                           Anesthesia Physical Anesthesia Plan  ASA: II  Anesthesia Plan: General   Post-op Pain Management:    Induction: Intravenous  Airway Management Planned: Oral ETT  Additional Equipment:   Intra-op Plan:   Post-operative Plan: Extubation in OR  Informed Consent: I have reviewed the patients History and Physical, chart, labs and discussed the procedure including the risks, benefits and alternatives for the proposed anesthesia with the patient or authorized representative who has indicated his/her understanding and acceptance.     Plan Discussed with: CRNA and Surgeon  Anesthesia Plan Comments:         Anesthesia Quick Evaluation

## 2012-02-10 NOTE — Anesthesia Postprocedure Evaluation (Signed)
  Anesthesia Post-op Note  Patient: Stephanie [name]  Procedure(s) Performed: Procedure(s) (LRB): LUMBAR LAMINECTOMY/DECOMPRESSION MICRODISCECTOMY 1 LEVEL (Left)  Patient Location: PACU  Anesthesia Type: General  Level of Consciousness: awake  Airway and Oxygen Therapy: Patient Spontanous Breathing  Post-op Pain: mild  Post-op Assessment: Post-op Vital signs reviewed  Post-op Vital Signs: stable  Complications: No apparent anesthesia complications

## 2012-02-10 NOTE — Transfer of Care (Signed)
Immediate Anesthesia Transfer of Care Note  Patient: Stephanie [name]  Procedure(s) Performed: Procedure(s) (LRB): LUMBAR LAMINECTOMY/DECOMPRESSION MICRODISCECTOMY 1 LEVEL (Left)  Patient Location: PACU  Anesthesia Type: General  Level of Consciousness: awake, alert , oriented and sedated  Airway & Oxygen Therapy: Patient Spontanous Breathing and Patient connected to nasal cannula oxygen  Post-op Assessment: Report given to PACU RN, Post -op Vital signs reviewed and stable and Patient moving all extremities  Post vital signs: Reviewed and stable  Complications: No apparent anesthesia complications

## 2012-02-10 NOTE — Op Note (Signed)
Date of procedure: 02/10/2012  Date of dictation: Same  Service: Neurosurgery  Preoperative diagnosis: Left paracentral L4-5 herniated nucleus pulposus with radiculopathy.  Postoperative diagnosis: Same  Procedure Name: Left L4-5 laminotomy and microdiscectomy.  Surgeon:Braeley Buskey A.Mikle Sternberg, M.D.  Asst. Surgeon: Hilda Lias  Anesthesia: General  Indication: A 41 year old female with back and left lower extremity pain consistent with a left-sided L5 radiculopathy. Workup demonstrates evidence of a rather large left L4-5 paracentral disc herniation with compression upon the thecal sac and left L5 nerve root. Patient presents now for microdiscectomy in hopes of improving her symptoms.  Operative note: After induction of anesthesia, patient was positioned positioned prone onto a Wilson frame. Patient's lumbar region was prepped and draped sterilely. 10 blade is used to make a linear skin incision overlying the L4-5 interspace. This is carried down sharply in the midline. Subperiosteal dissection then performed exposing the lamina and facet joints of L4 and L5 on the left. Deep self-retaining traction placed intraoperative x-ray was taken and the level was confirmed. Laminotomy was then performed using a high-speed drill and Kerrison rongeurs. Ligament of flavum was elevated and resected in a piecemeal fashion using Kerrison rongeurs. Underlying thecal sac and L5 nerve root were then applied. Microscope is brought into the field using the remainder of the microdiscectomy. Epidural venous plexus was quite limited and cut. Thecal sac and L5 nerve root gently mobilized and retracted towards midline. Disc herniation was readily apparent. This is then incised with a 15 blade in a rectangular fashion. Y. disc space clear was achieved using pituitary rongeurs operative of pituitary rongeurs and Epstein curettes. All elements of the disc herniation were completely resected. All loose are off the degenerative tear  was removed and interspace. At this point a very thorough discectomy been achieved. There was no evidence of injury to the thecal sac and nerve roots. Wound was irrigated with and bike solution. Gelfoam was placed topically for hemostasis. Microscope retractor system were removed. Hemostasis of the muscle was achieved with electrocautery. Wounds and close in layers of Vicryl sutures. Steri-Strips and sterile dressing were applied. There were no apparent complications. Patient tolerated procedure well and returned to the recovery room postop.

## 2012-02-10 NOTE — Brief Op Note (Signed)
02/10/2012  9:10 AM  PATIENT:  Maghan L Elahi  41 y.o. female  PRE-OPERATIVE DIAGNOSIS:  Herniated Nucleus Pulposus  POST-OPERATIVE DIAGNOSIS:  Herniated Nucleus Pulposus  PROCEDURE:  Procedure(s) (LRB): LUMBAR LAMINECTOMY/DECOMPRESSION MICRODISCECTOMY 1 LEVEL (Left)  SURGEON:  Surgeon(s) and Role:    * Temple Pacini, MD - Primary    * Karn Cassis, MD - Assisting  PHYSICIAN ASSISTANT:   ASSISTANTS: Hilda Lias   ANESTHESIA:   general  EBL:  Total I/O In: 2000 [I.V.:2000] Out: 100 [Blood:100]  BLOOD ADMINISTERED:none  DRAINS: none   LOCAL MEDICATIONS USED:  MARCAINE     SPECIMEN:  No Specimen  DISPOSITION OF SPECIMEN:  N/A  COUNTS:  YES  TOURNIQUET:  * No tourniquets in log *  DICTATION: .Dragon Dictation  PLAN OF CARE: Admit for overnight observation  PATIENT DISPOSITION:  PACU - hemodynamically stable.   Delay start of Pharmacological VTE agent (>24hrs) due to surgical blood loss or risk of bleeding: yes

## 2012-02-10 NOTE — H&P (Signed)
Stephanie [name] is an 41 y.o. female.   Chief Complaint: Back and left leg pain HPI: 41 year old female with severe back and left lower extremity pain failing conservative measure workup demonstrates evidence of a left-sided L4-5 disc herniation with compression of the thecal sac and left-sided L5 nerve root. Patient has been casted after options. She presents now for left-sided L4-5 laminotomy and microdiscectomy.  Past Medical History  Diagnosis Date  . PONV (postoperative nausea and vomiting)   . Pinched nerve     left side-leg    Past Surgical History  Procedure Date  . Cyst on throat     at age 19  . Cesarean section 94/97/03    Family History  Problem Relation Age of Onset  . Anesthesia problems Mother    Social History:  reports that she has never smoked. She does not have any smokeless tobacco history on file. She reports that she does not drink alcohol or use illicit drugs.  Allergies:  Allergies  Allergen Reactions  . Morphine And Related Itching    Medications Prior to Admission  Medication Dose Route Frequency Provider Last Rate Last Dose  . bacitracin 16109 UNITS injection           . ceFAZolin (ANCEF) IVPB 2 g/50 mL premix  2 g Intravenous 60 min Pre-Op Temple Pacini, MD      . dexamethasone (DECADRON) injection 10 mg  10 mg Intravenous Once Temple Pacini, MD      . HYDROmorphone (DILAUDID) injection 0.25-0.5 mg  0.25-0.5 mg Intravenous Q5 min PRN Bedelia Person, MD      . LORazepam (ATIVAN) injection 1 mg  1 mg Intravenous Once PRN Bedelia Person, MD      . promethazine (PHENERGAN) injection 6.25-12.5 mg  6.25-12.5 mg Intravenous Q15 min PRN Bedelia Person, MD      . sodium chloride 0.9 % infusion            Medications Prior to Admission  Medication Sig Dispense Refill  . aspirin 325 MG tablet Take 325 mg by mouth daily.      . cyclobenzaprine (FLEXERIL) 5 MG tablet Take 1 tablet (5 mg total) by mouth 3 (three) times daily as needed for muscle spasms.  30 tablet  0  .  diazepam (VALIUM) 5 MG tablet Take 5 mg by mouth every 12 (twelve) hours.      . gabapentin (NEURONTIN) 300 MG capsule Take 300-600 mg by mouth at bedtime. 1-2 tabs depending on amount of pain.      Marland Kitchen oxyCODONE-acetaminophen (PERCOCET) 10-325 MG per tablet Take 1 tablet by mouth every 4 (four) hours as needed for pain.  20 tablet  0  . oxyCODONE-acetaminophen (PERCOCET) 5-325 MG per tablet Take 1 tablet by mouth every 4 (four) hours as needed.        Results for orders placed during the hospital encounter of 02/09/12 (from the past 48 hour(s))  TYPE AND SCREEN     Status: Normal   Collection Time   02/09/12  3:20 PM      Component Value Range Comment   ABO/RH(D) A POS      Antibody Screen NEG      Sample Expiration 02/12/2012     ABO/RH     Status: Normal   Collection Time   02/09/12  3:20 PM      Component Value Range Comment   ABO/RH(D) A POS      No results found.  Review of Systems  Constitutional: Negative.   HENT: Negative.   Eyes: Negative.   Respiratory: Negative.   Cardiovascular: Negative.   Gastrointestinal: Negative.   Genitourinary: Negative.   Musculoskeletal: Negative.   Skin: Negative.   Neurological: Negative.   Endo/Heme/Allergies: Negative.   Psychiatric/Behavioral: Negative.     Blood pressure 126/88, pulse 86, temperature 98.2 F (36.8 C), temperature source Oral, resp. rate 16, last menstrual period 01/29/2012, SpO2 98.00%. Physical Exam  Constitutional: She is oriented to person, place, and time. She appears well-developed and well-nourished.  HENT:  Head: Normocephalic and atraumatic.  Right Ear: External ear normal.  Left Ear: External ear normal.  Nose: Nose normal.  Mouth/Throat: Oropharynx is clear and moist.  Eyes: Conjunctivae and EOM are normal. Pupils are equal, round, and reactive to light. Right eye exhibits no discharge. Left eye exhibits no discharge.  Neck: Normal range of motion. Neck supple. No tracheal deviation present. No  thyromegaly present.  Cardiovascular: Normal rate, regular rhythm, normal heart sounds and intact distal pulses.   No murmur heard. Respiratory: Effort normal and breath sounds normal. No respiratory distress. She has no wheezes.  GI: Soft. Bowel sounds are normal. She exhibits no distension. There is no tenderness.  Musculoskeletal: Normal range of motion. She exhibits no edema and no tenderness.  Neurological: She is alert and oriented to person, place, and time. She has normal reflexes. She displays normal reflexes. No cranial nerve deficit. She exhibits normal muscle tone. Coordination normal.  Skin: Skin is warm and dry. No rash noted. No erythema. No pallor.  Psychiatric: She has a normal mood and affect. Her behavior is normal. Judgment and thought content normal.     Assessment/Plan Left L4-5 herniated nucleus pulposus with radiculopathy. Plan left L4-5 laminotomy and microdiscectomy. Risks and benefits have been explained. Patient wishes to proceed.  Joshlyn Beadle A 02/10/2012, 7:32 AM

## 2012-02-10 NOTE — Preoperative (Signed)
Beta Blockers   Reason not to administer Beta Blockers:Not Applicable 

## 2012-02-11 MED ORDER — CYCLOBENZAPRINE HCL 10 MG PO TABS: 10.0000 mg | ORAL_TABLET | Freq: Three times a day (TID) | ORAL | Status: AC | PRN

## 2012-02-11 MED ORDER — HYDROCODONE-ACETAMINOPHEN 5-325 MG PO TABS: 1.0000 | ORAL_TABLET | ORAL | Status: AC | PRN

## 2012-02-11 NOTE — Discharge Summary (Signed)
Physician Discharge Summary  Patient ID: Stephanie [name] MRN: [id number] DOB/AGE: 1971/08/13 41 y.o.  Admit date: 02/10/2012 Discharge date: 02/11/2012  Admission Diagnoses:  Discharge Diagnoses:  Principal Problem:  *Lumbar disc herniation with radiculopathy   Discharged Condition: good  Hospital Course: Admitted to the hospital where she underwent uncomplicated lumbar laminotomy and microdiscectomy. Postoperative she has done well. Preoperative back and left lower extremity pain has resolved. Strength and sensation are much improved. Wound healing well. Plan for discharge home.  Consults:   Significant Diagnostic Studies:   Treatments:   Discharge Exam: Blood pressure 109/63, pulse 76, temperature 97.6 F (36.4 C), temperature source Oral, resp. rate 20, last menstrual period 01/29/2012, SpO2 98.00%. Awake and alert oriented and appropriate. Cranial nerve function is intact. Motor and sensory function extremities is normal aside from some slight decreased sensation to light touch in her left S1 dermatome. Deep tendon versus a normal active. Wound healing well. Chest and abdomen benign.  Disposition: 01-Home or Self Care   Medication List  As of 02/11/2012  9:33 AM   STOP taking these medications         diazepam 5 MG tablet         TAKE these medications         aspirin 325 MG tablet   Take 325 mg by mouth daily.      cyclobenzaprine 10 MG tablet   Commonly known as: FLEXERIL   Take 1 tablet (10 mg total) by mouth 3 (three) times daily as needed for muscle spasms.      gabapentin 300 MG capsule   Commonly known as: NEURONTIN   Take 300-600 mg by mouth at bedtime. 1-2 tabs depending on amount of pain.      HYDROcodone-acetaminophen 5-325 MG per tablet   Commonly known as: NORCO   Take 1-2 tablets by mouth every 4 (four) hours as needed.      oxyCODONE-acetaminophen 5-325 MG per tablet   Commonly known as: PERCOCET   Take 1 tablet by mouth every 4 (four) hours  as needed.           Follow-up Information    Follow up with Cedar Ditullio A, MD. Call in 1 week.   Contact information:   [address] [place] [address] [phone]          Signed: Temple Pacini 02/11/2012, 9:33 AM

## 2012-02-11 NOTE — Discharge Instructions (Signed)
Wound Care Keep incision covered and dry for one week.  If you shower prior to then, cover incision with plastic wrap.  You may remove outer bandage after one week and shower.  Do not put any creams, lotions, or ointments on incision. Leave steri-strips on neck.  They will fall off by themselves. Activity Walk each and every day, increasing distance each day. No lifting greater than 5 lbs.  Avoid excessive neck motion. No driving for 2 weeks; may ride as a passenger locally.  Diet Resume your normal diet.  Return to Work Will be discussed at you follow up appointment. Call Your Doctor If Any of These Occur Redness, drainage, or swelling at the wound.  Temperature greater than 101 degrees. Severe pain not relieved by pain medication. Incision starts to come apart. Follow Up Appt Call today for appointment in 1-2 weeks ([phone]) or for problems.  If you have any hardware placed in your spine, you will need an x-ray before your appointment.

## 2012-02-11 NOTE — Telephone Encounter (Signed)
Dr Georgiana Shore, OK for O[facility] note?

## 2012-02-11 NOTE — Telephone Encounter (Signed)
Pt would like for Dr to write a Dr's note for work from 2/5-2/8 she was seen by Dr Georgiana Shore and Dr Althea Charon and also seen in ER and was diagnosed with a ruptured disc please contact patient if any questions and call when Dr's note ready for pick-up

## 2012-02-12 NOTE — Telephone Encounter (Signed)
OOW note written and in drawer. Pt notified.

## 2012-02-12 NOTE — Telephone Encounter (Signed)
[facility] note is fine for those dates.

## 2012-02-15 ENCOUNTER — Encounter (HOSPITAL_COMMUNITY): Payer: Self-pay | Admitting: Neurosurgery

## 2012-06-01 ENCOUNTER — Other Ambulatory Visit (HOSPITAL_COMMUNITY): Payer: Self-pay | Admitting: Interventional Radiology

## 2012-06-01 DIAGNOSIS — H93A9 Pulsatile tinnitus, unspecified ear: Secondary | ICD-10-CM

## 2012-06-10 ENCOUNTER — Ambulatory Visit (HOSPITAL_COMMUNITY): Payer: 59

## 2012-06-11 ENCOUNTER — Other Ambulatory Visit: Payer: Self-pay | Admitting: Radiology

## 2012-06-17 ENCOUNTER — Encounter (HOSPITAL_COMMUNITY): Payer: Self-pay | Admitting: Pharmacy Technician

## 2012-06-18 ENCOUNTER — Ambulatory Visit (HOSPITAL_COMMUNITY)
Admission: RE | Admit: 2012-06-18 | Discharge: 2012-06-18 | Disposition: A | Discharge status: Home / Self Care | Payer: 59 | Source: Ambulatory Visit | Attending: Interventional Radiology | Admitting: Interventional Radiology

## 2012-06-18 ENCOUNTER — Encounter (HOSPITAL_COMMUNITY): Payer: Self-pay

## 2012-06-18 ENCOUNTER — Other Ambulatory Visit (HOSPITAL_COMMUNITY): Payer: Self-pay | Admitting: Interventional Radiology

## 2012-06-18 DIAGNOSIS — I672 Cerebral atherosclerosis: Secondary | ICD-10-CM | POA: Insufficient documentation

## 2012-06-18 DIAGNOSIS — H93A9 Pulsatile tinnitus, unspecified ear: Secondary | ICD-10-CM

## 2012-06-18 DIAGNOSIS — H9319 Tinnitus, unspecified ear: Secondary | ICD-10-CM | POA: Insufficient documentation

## 2012-06-18 LAB — BASIC METABOLIC PANEL
BUN: 11 mg/dL (ref 6–23)
Chloride: 104 mEq/L (ref 96–112)
Creatinine, Ser: 0.6 mg/dL (ref 0.50–1.10)
GFR calc Af Amer: >90 mL/min (ref >90)

## 2012-06-18 LAB — DIFFERENTIAL
Basophils Absolute: 0.1 10*3/uL (ref 0.0–0.1)
Basophils Relative: 1 % (ref 0–1)
Eosinophils Absolute: 0.1 10*3/uL (ref 0.0–0.7)
Eosinophils Relative: 1 % (ref 0–5)
Monocytes Absolute: 0.6 10*3/uL (ref 0.1–1.0)
Neutro Abs: 4.8 10*3/uL (ref 1.7–7.7)

## 2012-06-18 LAB — CBC
HCT: 39.5 % (ref 36.0–46.0)
MCH: 29.8 pg (ref 26.0–34.0)
MCHC: 33.7 g/dL (ref 30.0–36.0)
RDW: 13.5 % (ref 11.5–15.5)

## 2012-06-18 MED ORDER — FENTANYL CITRATE 0.05 MG/ML IJ SOLN
INTRAMUSCULAR | Status: AC
  Filled 2012-06-18: qty 4

## 2012-06-18 MED ORDER — MIDAZOLAM HCL 5 MG/5ML IJ SOLN
INTRAMUSCULAR | Status: AC | PRN
  Administered 2012-06-18: 1 mg via INTRAVENOUS

## 2012-06-18 MED ORDER — SODIUM CHLORIDE 0.9 % IV SOLN
Freq: Once | INTRAVENOUS | Status: AC
  Administered 2012-06-18: 09:00:00 via INTRAVENOUS

## 2012-06-18 MED ORDER — HEPARIN SOD (PORK) LOCK FLUSH 100 UNIT/ML IV SOLN
500.0000 [IU] | Freq: Once | INTRAVENOUS | Status: AC
  Administered 2012-06-18: 500 [IU] via INTRAVENOUS

## 2012-06-18 MED ORDER — FENTANYL CITRATE 0.05 MG/ML IJ SOLN
INTRAMUSCULAR | Status: AC | PRN
  Administered 2012-06-18: 25 ug via INTRAVENOUS

## 2012-06-18 MED ORDER — IOHEXOL 300 MG/ML  SOLN
150.0000 mL | Freq: Once | INTRAMUSCULAR | Status: AC | PRN
  Administered 2012-06-18: 66 mL via INTRA_ARTERIAL

## 2012-06-18 MED ORDER — MIDAZOLAM HCL 2 MG/2ML IJ SOLN
INTRAMUSCULAR | Status: AC
  Filled 2012-06-18: qty 4

## 2012-06-18 NOTE — Procedures (Signed)
S/P 4 vessel cerebral arteriogram RT CFA approach  Findings  1. 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 Hypoplasric lt transverse sinus

## 2012-06-18 NOTE — Discharge Instructions (Signed)

## 2012-06-18 NOTE — H&P (Signed)
Stephanie [name] is an 41 y.o. female.   Chief Complaint: Rt ear tinnitus x months.  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 Scheduled for cerebral arteriogram in re evaluation HPI: lumbar disc; rt ear tinnitus  Past Medical History  Diagnosis Date  . PONV (postoperative nausea and vomiting)   . Pinched nerve     left side-leg    Past Surgical History  Procedure Date  . Cyst on throat     at age 28  . Cesarean section 94/97/03  . Lumbar laminectomy/decompression microdiscectomy 02/10/2012    Procedure: LUMBAR LAMINECTOMY/DECOMPRESSION MICRODISCECTOMY 1 LEVEL;  Surgeon: Temple Pacini, MD;  Location: [facility];  Service: Neurosurgery;  Laterality: Left;  Left Lumbar Four-Five Laminectomy and Microdiskectomy    Family History  Problem Relation Age of Onset  . Anesthesia problems Mother    Social History:  reports that she has never smoked. She does not have any smokeless tobacco history on file. She reports that she does not drink alcohol or use illicit drugs.  Allergies:  Allergies  Allergen Reactions  . Morphine And Related Itching     (Not in a hospital admission)  Results for orders placed during the hospital encounter of 06/18/12 (from the past 48 hour(s))  CBC     Status: Normal   Collection Time   06/18/12  8:39 AM      Component Value Range Comment   WBC 7.1  4.0 - 10.5 K/uL    RBC 4.46  3.87 - 5.11 MIL/uL    Hemoglobin 13.3  12.0 - 15.0 g/dL    HCT 19.1  47.8 - 29.5 %    MCV 88.6  78.0 - 100.0 fL    MCH 29.8  26.0 - 34.0 pg    MCHC 33.7  30.0 - 36.0 g/dL    RDW 62.1  30.8 - 65.7 %    Platelets 342  150 - 400 K/uL   DIFFERENTIAL     Status: Normal   Collection Time   06/18/12  8:39 AM      Component Value Range Comment   Neutrophils Relative 69  43 - 77 %    Neutro Abs 4.8  1.7 - 7.7  K/uL    Lymphocytes Relative 21  12 - 46 %    Lymphs Abs 1.5  0.7 - 4.0 K/uL    Monocytes Relative 8  3 - 12 %    Monocytes Absolute 0.6  0.1 - 1.0 K/uL    Eosinophils Relative 1  0 - 5 %    Eosinophils Absolute 0.1  0.0 - 0.7 K/uL    Basophils Relative 1  0 - 1 %    Basophils Absolute 0.1  0.0 - 0.1 K/uL    No results found.  Review of Systems  Constitutional: Negative for fever.  HENT: Positive for ear pain and tinnitus.        Rt ear tinnitus  Eyes: Negative for blurred vision.  Respiratory: Negative for cough.   Cardiovascular: Negative for chest pain.  Gastrointestinal: Negative for nausea and vomiting.  Musculoskeletal: Positive for back pain.  Neurological: Positive for headaches. Negative for dizziness.    Blood pressure 126/87, pulse 80, temperature 97.7 F (36.5 C), temperature source Oral, resp. rate 18, height 5\' 4"  (1.626 m), weight 230  lb (104.327 kg), last menstrual period 06/04/2012, SpO2 96.00%. Physical Exam  Constitutional: She is oriented to person, place, and time. She appears well-developed and well-nourished.  HENT:  Head: Normocephalic.  Eyes: EOM are normal.  Neck: Normal range of motion.  Cardiovascular: Normal rate, regular rhythm and normal heart sounds.   No murmur heard. Respiratory: Effort normal and breath sounds normal. She has no wheezes.  GI: Soft. Bowel sounds are normal. There is no tenderness.  Musculoskeletal: Normal range of motion.       Back pain- post disc surgery 01/2012  Neurological: She is alert and oriented to person, place, and time. No cranial nerve deficit.  Skin: Skin is warm and dry.  Psychiatric: She has a normal mood and affect. Her behavior is normal. Judgment and thought content normal.     Assessment/Plan Rt ear tinnitus x months. Last arteriogram 01/2012 Shows rt transverse sinus stenosis Pt has been on daily asa only x 3 days - had back surgery just few days after last arteriogram and had to come off asa and did  not restart til 3 days ago Scheduled now for cerebral arteriogram for reeval. Pt aware of procedure benefits and risks and agreeable to proceed. Consent in chart  Karolina Zamor A 06/18/2012, 9:16 AM

## 2012-08-10 ENCOUNTER — Other Ambulatory Visit: Payer: Self-pay | Admitting: Family Medicine

## 2012-08-10 DIAGNOSIS — R1011 Right upper quadrant pain: Secondary | ICD-10-CM

## 2012-08-11 ENCOUNTER — Ambulatory Visit
Admission: RE | Admit: 2012-08-11 | Discharge: 2012-08-11 | Disposition: A | Discharge status: Home / Self Care | Payer: 59 | Source: Ambulatory Visit | Attending: Family Medicine | Admitting: Family Medicine

## 2012-08-11 DIAGNOSIS — R1011 Right upper quadrant pain: Secondary | ICD-10-CM

## 2012-08-12 ENCOUNTER — Observation Stay (HOSPITAL_COMMUNITY)
Admission: EM | Admit: 2012-08-12 | Discharge: 2012-08-13 | Disposition: A | Discharge status: Home / Self Care | Payer: 59 | Attending: General Surgery | Admitting: General Surgery

## 2012-08-12 ENCOUNTER — Encounter (HOSPITAL_COMMUNITY): Admission: EM | Disposition: A | Discharge status: Home / Self Care | Payer: Self-pay | Source: Home / Self Care

## 2012-08-12 ENCOUNTER — Encounter (HOSPITAL_COMMUNITY): Payer: Self-pay | Admitting: Emergency Medicine

## 2012-08-12 ENCOUNTER — Emergency Department (HOSPITAL_COMMUNITY): Payer: 59

## 2012-08-12 ENCOUNTER — Telehealth (INDEPENDENT_AMBULATORY_CARE_PROVIDER_SITE_OTHER): Payer: Self-pay

## 2012-08-12 ENCOUNTER — Encounter (HOSPITAL_COMMUNITY): Payer: Self-pay | Admitting: Anesthesiology

## 2012-08-12 ENCOUNTER — Emergency Department (HOSPITAL_COMMUNITY): Payer: 59 | Admitting: Anesthesiology

## 2012-08-12 DIAGNOSIS — K8010 Calculus of gallbladder with chronic cholecystitis without obstruction: Principal | ICD-10-CM | POA: Insufficient documentation

## 2012-08-12 DIAGNOSIS — K8020 Calculus of gallbladder without cholecystitis without obstruction: Secondary | ICD-10-CM | POA: Diagnosis present

## 2012-08-12 HISTORY — PX: CHOLECYSTECTOMY: SHX55

## 2012-08-12 LAB — COMPREHENSIVE METABOLIC PANEL
ALT: 166 U/L — ABNORMAL HIGH (ref 0–35)
AST: 53 U/L — ABNORMAL HIGH (ref 0–37)
CO2: 24 mEq/L (ref 19–32)
Calcium: 8.9 mg/dL (ref 8.4–10.5)
GFR calc non Af Amer: >90 mL/min (ref >90)
Sodium: 139 mEq/L (ref 135–145)

## 2012-08-12 LAB — CBC WITH DIFFERENTIAL/PLATELET
Basophils Absolute: 0.1 10*3/uL (ref 0.0–0.1)
Eosinophils Relative: 1 % (ref 0–5)
Lymphocytes Relative: 25 % (ref 12–46)
MCV: 88.7 fL (ref 78.0–100.0)
Neutro Abs: 4.5 10*3/uL (ref 1.7–7.7)
Neutrophils Relative %: 67 % (ref 43–77)
Platelets: 354 10*3/uL (ref 150–400)
RDW: 13.6 % (ref 11.5–15.5)
WBC: 6.8 10*3/uL (ref 4.0–10.5)

## 2012-08-12 SURGERY — LAPAROSCOPIC CHOLECYSTECTOMY WITH INTRAOPERATIVE CHOLANGIOGRAM
Anesthesia: General | Site: Abdomen | Wound class: Contaminated

## 2012-08-12 MED ORDER — HYDROMORPHONE HCL PF 1 MG/ML IJ SOLN
INTRAMUSCULAR | Status: AC
  Filled 2012-08-12: qty 1

## 2012-08-12 MED ORDER — ONDANSETRON HCL 4 MG/2ML IJ SOLN
INTRAMUSCULAR | Status: DC | PRN
  Administered 2012-08-12: 4 mg via INTRAVENOUS

## 2012-08-12 MED ORDER — FENTANYL CITRATE 0.05 MG/ML IJ SOLN
INTRAMUSCULAR | Status: DC | PRN
  Administered 2012-08-12: 50 ug via INTRAVENOUS
  Administered 2012-08-12: 100 ug via INTRAVENOUS
  Administered 2012-08-12 (×2): 50 ug via INTRAVENOUS

## 2012-08-12 MED ORDER — HYDROMORPHONE HCL PF 1 MG/ML IJ SOLN: 0.5000 mg | INTRAMUSCULAR | Status: DC | PRN

## 2012-08-12 MED ORDER — ACETAMINOPHEN 325 MG PO TABS: 650.0000 mg | ORAL_TABLET | ORAL | Status: DC | PRN

## 2012-08-12 MED ORDER — IOHEXOL 300 MG/ML  SOLN
INTRAMUSCULAR | Status: AC
  Filled 2012-08-12: qty 1

## 2012-08-12 MED ORDER — DEXAMETHASONE SODIUM PHOSPHATE 10 MG/ML IJ SOLN
INTRAMUSCULAR | Status: DC | PRN
  Administered 2012-08-12: 10 mg via INTRAVENOUS

## 2012-08-12 MED ORDER — GLYCOPYRROLATE 0.2 MG/ML IJ SOLN
INTRAMUSCULAR | Status: DC | PRN
  Administered 2012-08-12: .5 mg via INTRAVENOUS

## 2012-08-12 MED ORDER — ONDANSETRON HCL 4 MG/2ML IJ SOLN: 4.0000 mg | Freq: Four times a day (QID) | INTRAMUSCULAR | Status: DC | PRN

## 2012-08-12 MED ORDER — ONDANSETRON HCL 4 MG PO TABS: 4.0000 mg | ORAL_TABLET | Freq: Four times a day (QID) | ORAL | Status: DC | PRN

## 2012-08-12 MED ORDER — LACTATED RINGERS IV SOLN: INTRAVENOUS | Status: DC

## 2012-08-12 MED ORDER — IOHEXOL 300 MG/ML  SOLN
INTRAMUSCULAR | Status: DC | PRN
  Administered 2012-08-12: 15 mL via INTRAVENOUS

## 2012-08-12 MED ORDER — LACTATED RINGERS IR SOLN
Status: DC | PRN
  Administered 2012-08-12: 1000 mL

## 2012-08-12 MED ORDER — PROPOFOL 10 MG/ML IV EMUL
INTRAVENOUS | Status: DC | PRN
  Administered 2012-08-12: 140 mg via INTRAVENOUS

## 2012-08-12 MED ORDER — LACTATED RINGERS IV SOLN
INTRAVENOUS | Status: DC | PRN
  Administered 2012-08-12 (×2): via INTRAVENOUS

## 2012-08-12 MED ORDER — PROMETHAZINE HCL 25 MG/ML IJ SOLN
INTRAMUSCULAR | Status: AC
  Filled 2012-08-12: qty 1

## 2012-08-12 MED ORDER — CIPROFLOXACIN IN D5W 400 MG/200ML IV SOLN
INTRAVENOUS | Status: AC
  Filled 2012-08-12: qty 200

## 2012-08-12 MED ORDER — NEOSTIGMINE METHYLSULFATE 1 MG/ML IJ SOLN
INTRAMUSCULAR | Status: DC | PRN
  Administered 2012-08-12: 4 mg via INTRAVENOUS

## 2012-08-12 MED ORDER — MEPERIDINE HCL 50 MG/ML IJ SOLN: 6.2500 mg | INTRAMUSCULAR | Status: DC | PRN

## 2012-08-12 MED ORDER — ONDANSETRON HCL 4 MG/2ML IJ SOLN: 4.0000 mg | INTRAMUSCULAR | Status: DC | PRN

## 2012-08-12 MED ORDER — BUPIVACAINE-EPINEPHRINE 0.5% -1:200000 IJ SOLN
INTRAMUSCULAR | Status: DC | PRN
  Administered 2012-08-12: 20 mL

## 2012-08-12 MED ORDER — HYDROMORPHONE HCL PF 1 MG/ML IJ SOLN: 1.0000 mg | INTRAMUSCULAR | Status: DC | PRN

## 2012-08-12 MED ORDER — CISATRACURIUM BESYLATE (PF) 10 MG/5ML IV SOLN
INTRAVENOUS | Status: DC | PRN
  Administered 2012-08-12: 2 mg via INTRAVENOUS
  Administered 2012-08-12: 8 mg via INTRAVENOUS

## 2012-08-12 MED ORDER — BUPIVACAINE-EPINEPHRINE (PF) 0.5% -1:200000 IJ SOLN
INTRAMUSCULAR | Status: AC
  Filled 2012-08-12: qty 10

## 2012-08-12 MED ORDER — MIDAZOLAM HCL 5 MG/5ML IJ SOLN
INTRAMUSCULAR | Status: DC | PRN
  Administered 2012-08-12: 2 mg via INTRAVENOUS

## 2012-08-12 MED ORDER — HYDROCODONE-ACETAMINOPHEN 5-325 MG PO TABS
1.0000 | ORAL_TABLET | ORAL | Status: DC | PRN
  Administered 2012-08-12: 2 via ORAL
  Filled 2012-08-12: qty 2

## 2012-08-12 MED ORDER — KCL IN DEXTROSE-NACL 20-5-0.45 MEQ/L-%-% IV SOLN
INTRAVENOUS | Status: DC
  Administered 2012-08-12: 75 mL/h via INTRAVENOUS
  Filled 2012-08-12 (×2): qty 1000

## 2012-08-12 MED ORDER — PROMETHAZINE HCL 25 MG/ML IJ SOLN
6.2500 mg | INTRAMUSCULAR | Status: DC | PRN
  Administered 2012-08-12: 6.25 mg via INTRAVENOUS

## 2012-08-12 MED ORDER — CIPROFLOXACIN IN D5W 400 MG/200ML IV SOLN
400.0000 mg | Freq: Once | INTRAVENOUS | Status: AC
  Administered 2012-08-12: 400 mg via INTRAVENOUS

## 2012-08-12 MED ORDER — HYDROMORPHONE HCL PF 1 MG/ML IJ SOLN
0.2500 mg | INTRAMUSCULAR | Status: DC | PRN
  Administered 2012-08-12 (×3): 0.5 mg via INTRAVENOUS

## 2012-08-12 SURGICAL SUPPLY — 43 items
APL SKNCLS STERI-STRIP NONHPOA (GAUZE/BANDAGES/DRESSINGS) ×1
APPLIER CLIP ROT 10 11.4 M/L (STAPLE) ×2
APR CLP MED LRG 11.4X10 (STAPLE) ×1
BAG SPEC RTRVL LRG 6X4 10 (ENDOMECHANICALS) ×1
BENZOIN TINCTURE PRP APPL 2/3 (GAUZE/BANDAGES/DRESSINGS) ×2 IMPLANT
CABLE HIGH FREQUENCY MONO STRZ (ELECTRODE) ×2 IMPLANT
CANISTER SUCTION 2500CC (MISCELLANEOUS) ×2 IMPLANT
CHLORAPREP W/TINT 26ML (MISCELLANEOUS) ×2 IMPLANT
CLIP APPLIE ROT 10 11.4 M/L (STAPLE) ×1 IMPLANT
CLOSURE STERI-STRIP 1/4X4 (GAUZE/BANDAGES/DRESSINGS) ×1 IMPLANT
CLOTH BEACON ORANGE TIMEOUT ST (SAFETY) ×2 IMPLANT
COVER MAYO STAND STRL (DRAPES) ×2 IMPLANT
DECANTER SPIKE VIAL GLASS SM (MISCELLANEOUS) ×2 IMPLANT
DRAPE C-ARM 42X72 X-RAY (DRAPES) ×2 IMPLANT
DRAPE LAPAROSCOPIC ABDOMINAL (DRAPES) ×2 IMPLANT
DRAPE UTILITY 15X26 (DRAPE) ×2 IMPLANT
ELECT REM PT RETURN 9FT ADLT (ELECTROSURGICAL) ×2
ELECTRODE REM PT RTRN 9FT ADLT (ELECTROSURGICAL) ×1 IMPLANT
GLOVE BIOGEL PI IND STRL 7.0 (GLOVE) ×1 IMPLANT
GLOVE BIOGEL PI INDICATOR 7.0 (GLOVE) ×1
GLOVE SURG ORTHO 8.0 STRL STRW (GLOVE) ×2 IMPLANT
GOWN STRL NON-REIN LRG LVL3 (GOWN DISPOSABLE) ×2 IMPLANT
GOWN STRL REIN XL XLG (GOWN DISPOSABLE) ×4 IMPLANT
HEMOSTAT SURGICEL 4X8 (HEMOSTASIS) IMPLANT
KIT BASIN OR (CUSTOM PROCEDURE TRAY) ×2 IMPLANT
NS IRRIG 1000ML POUR BTL (IV SOLUTION) ×2 IMPLANT
POUCH SPECIMEN RETRIEVAL 10MM (ENDOMECHANICALS) ×2 IMPLANT
SCISSORS LAP 5X35 DISP (ENDOMECHANICALS) ×1 IMPLANT
SET CHOLANGIOGRAPH MIX (MISCELLANEOUS) ×2 IMPLANT
SET IRRIG TUBING LAPAROSCOPIC (IRRIGATION / IRRIGATOR) ×2 IMPLANT
SLEEVE Z-THREAD 5X100MM (TROCAR) ×2 IMPLANT
SOLUTION ANTI FOG 6CC (MISCELLANEOUS) ×2 IMPLANT
SPONGE GAUZE 4X4 12PLY (GAUZE/BANDAGES/DRESSINGS) ×1 IMPLANT
STRIP CLOSURE SKIN 1/2X4 (GAUZE/BANDAGES/DRESSINGS) ×2 IMPLANT
SUT MNCRL AB 4-0 PS2 18 (SUTURE) ×2 IMPLANT
TAPE CLOTH SURG 4X10 WHT LF (GAUZE/BANDAGES/DRESSINGS) ×1 IMPLANT
TOWEL OR 17X26 10 PK STRL BLUE (TOWEL DISPOSABLE) ×5 IMPLANT
TOWEL OR NON WOVEN STRL DISP B (DISPOSABLE) ×1 IMPLANT
TRAY LAP CHOLE (CUSTOM PROCEDURE TRAY) ×2 IMPLANT
TROCAR XCEL BLUNT TIP 100MML (ENDOMECHANICALS) ×2 IMPLANT
TROCAR Z-THREAD FIOS 11X100 BL (TROCAR) ×2 IMPLANT
TROCAR Z-THREAD FIOS 5X100MM (TROCAR) ×4 IMPLANT
TUBING INSUFFLATION 10FT LAP (TUBING) ×2 IMPLANT

## 2012-08-12 NOTE — Op Note (Signed)
Laparoscopic Cholecystectomy with IOC Procedure Note  Pre-operative Diagnosis: Calculus of gallbladder with other cholecystitis, without mention of obstruction  Post-operative Diagnosis: Same  Surgeon:  Velora Heckler, MD, FACS  Assistant:  Barnetta Chapel, PA-C   Anesthesia:  General  Indications: This patient presents with symptomatic gallbladder disease and will undergo laparoscopic cholecystectomy.  Procedure Details  The patient was seen in the pre-op holding area. The risks, benefits, complications, treatment options, and expected outcomes have been discussed with the patient. The patient and/or family agreed with the proposed plan and signed the informed consent form.  The patient was taken to Operating Room, identified as Stephanie [name] and the procedure verified as Laparoscopic Cholecystectomy with Intraoperative Cholangiogram. A "time out" was completed and the above information confirmed.  Prior to the induction of general anesthesia, antibiotic prophylaxis was administered. General endotracheal anesthesia was then administered and tolerated well. After the induction, the abdomen was prepped in the usual aseptic fashion. The patient was positioned in the supine position.  An incision was made in the skin near the umbilicus. The midline fascia was incised and the peritoneal cavity entered and the Hasson canula was introduced under direct vision. Hasson canula was secured with a pursestring 0-Vicryl suture. Pneumoperitoneum was then established with carbon dioxide and tolerated well without any adverse changes in the patient's vital signs. Additional trocars were introduced under direct vision along the right costal margin in the midline, mid-clavicular line, and anterior axillary line.  The gallbladder was identified and the fundus grasped and retracted cephalad. Adhesions were taken down bluntly and with the electrocautery as needed, taking care not to injure any adjacent structures.  The infundibulum was grasped and retracted laterally, exposing the peritoneum overlying the triangle of Calot. This was incised and structures exposed in a blunt fashion. The cystic duct was clearly identified and bluntly dissected circumferentially.  An incision was made in the cystic duct and the cholangiogram catheter introduced. The catheter was secured using an ligaclip.  Real-time cholangiography was performed using the C-arm.  There was rapid filling of a normal caliber common bile duct.  There was reflux of contrast into the left and right hepatic ductal systems.  There was free flow distally into the duodenum without filling defect or obstruction.  Catheter was removed from the peritoneal cavity.  The cystic duct was then triply ligated with surgical clips on the patient side and singly clipped on the gallbladder side and divided. The cystic artery was identified, dissected circumferentially, ligated with ligaclips, and divided.   The gallbladder was dissected from the liver bed with the electrocautery used for hemostasis. The gallbladder was completely removed and placed into an endocatch bag. The right upper quadrant was irrigated and inspected. Hemostasis was achieved with the electrocautery. Warm saline irrigation was utilized and was repeatedly aspirated until clear.  Pneumoperitoneum was released after viewing removal of the trocars with good hemostasis. The umbilical wound was irrigated and the fascia was then closed with the pursestring suture.  The skin was then closed with 4-0 Monocril subcuticular sutures and a sterile dressing was applied.  Instrument, sponge, and needle counts were correct at the conclusion of the case.  Findings: Cholecystitis with Cholelithiasis  Estimated Blood Loss: Minimal         Drains: none         Specimens: Gallbladder to pathology       Complications: None; patient tolerated the procedure well.         Disposition: PACU - hemodynamically  stable.         Condition: stable  Velora Heckler, MD, [facility] Office: [phone]

## 2012-08-12 NOTE — H&P (Signed)
Stephanie [name] 12/17/1971  [id number].   Primary Care MD: Dr. Laurann Montana Chief Complaint/Reason for Consult: gallstones HPI: This is a 41 yo female who has had a couple episodes of about 15 minutes worth of pain in her RUQ over the last several months that suddenly went away.  She thought it was gas or she overate and ignored it.  On Tuesday this pain started again and did not go away immediately.  She saw her PCP who sent her for an abdominal U/S which revealed a gallstone in the neck of the gallbladder.  She was referred to [facility] this morning for surgical intervention.  She is currently pain free and was able to eat dinner last night.  We have been asked to see her today for surgical intervention.  Review of Systems: Please see HPI, otherwise all other systems are negative  Family History  Problem Relation Age of Onset  . Anesthesia problems Mother     Past Medical History  Diagnosis Date  . PONV (postoperative nausea and vomiting)   . Pinched nerve     left side-leg    Past Surgical History  Procedure Date  . Cyst on throat     at age 67  . Cesarean section x3 94/97/03  . Lumbar laminectomy/decompression microdiscectomy 02/10/2012    Procedure: LUMBAR LAMINECTOMY/DECOMPRESSION MICRODISCECTOMY 1 LEVEL;  Surgeon: Temple Pacini, MD;  Location: [facility];  Service: Neurosurgery;  Laterality: Left;  Left Lumbar Four-Five Laminectomy and Microdiskectomy    Social History:  reports that she has never smoked. She has never used smokeless tobacco. She reports that she does not drink alcohol or use illicit drugs.  Allergies:  Allergies  Allergen Reactions  . Morphine And Related Itching     (Not in a hospital admission)  Blood pressure 136/90, pulse 80, temperature 98.4 F (36.9 C), temperature source Oral, resp. rate 16, last menstrual period 07/12/2012, SpO2 98.00%. Physical Exam: General: pleasant, obese white female who is laying in bed in NAD HEENT: head is normocephalic,  atraumatic.  Sclera are noninjected.  PERRL.  Ears and nose without any masses or lesions.  Mouth is pink and moist Heart: regular, rate, and rhythm.  Normal s1,s2. No obvious murmurs, gallops, or rubs noted.  Palpable radial and pedal pulses bilaterally Lungs: CTAB, no wheezes, rhonchi, or rales noted.  Respiratory effort nonlabored Abd: soft, NT, ND, +BS, no masses, hernias, or organomegaly MS: all 4 extremities are symmetrical with no cyanosis, clubbing, or edema. Skin: warm and dry with no masses, lesions, or rashes Psych: A&Ox3 with an appropriate affect.    Results for orders placed during the hospital encounter of 08/12/12 (from the past 48 hour(s))  CBC WITH DIFFERENTIAL     Status: Normal   Collection Time   08/12/12 11:30 AM      Component Value Range Comment   WBC 6.8  4.0 - 10.5 K/uL    RBC 4.32  3.87 - 5.11 MIL/uL    Hemoglobin 12.8  12.0 - 15.0 g/dL    HCT 78.2  95.6 - 21.3 %    MCV 88.7  78.0 - 100.0 fL    MCH 29.6  26.0 - 34.0 pg    MCHC 33.4  30.0 - 36.0 g/dL    RDW 08.6  57.8 - 46.9 %    Platelets 354  150 - 400 K/uL    Neutrophils Relative 67  43 - 77 %    Neutro Abs 4.5  1.7 - 7.7  K/uL    Lymphocytes Relative 25  12 - 46 %    Lymphs Abs 1.7  0.7 - 4.0 K/uL    Monocytes Relative 7  3 - 12 %    Monocytes Absolute 0.5  0.1 - 1.0 K/uL    Eosinophils Relative 1  0 - 5 %    Eosinophils Absolute 0.1  0.0 - 0.7 K/uL    Basophils Relative 1  0 - 1 %    Basophils Absolute 0.1  0.0 - 0.1 K/uL   HCG, SERUM, QUALITATIVE     Status: Normal   Collection Time   08/12/12 11:30 AM      Component Value Range Comment   Preg, Serum NEGATIVE  NEGATIVE    US Abdomen Limited  08/11/2012  *RADIOLOGY REPORT*  Clinical Data: Right upper quadrant pain.  Question gallstones?  LIMITED ABDOMINAL ULTRASOUND  Comparison:  None.  Findings:  Gallbladder:  1.3 cm gallstone lodged in the gallbladder neck.  No pericholecystic fluid or gallbladder wall thickening.  Per ultrasound technologist,  the patient was not tender over the gallbladder during scanning.  Common bile duct:  2.5 mm.  Liver: No focal hepatic lesion or intrahepatic biliary duct dilatation.  IMPRESSION: 1.3 cm gallstone lodged in the gallbladder neck.  No pericholecystic fluid or gallbladder wall thickening.  This has been made a PRA call report utilizing dashboard call feature.   Original Report Authenticated By: Fuller Canada, M.D.        Assessment/Plan 1. Cholelithiasis 2. Biliary colic  Plan: The patient has evidence of a gallstone lodged in the neck of the gallbladder neck.  She is currently asymptomatic, but was referred urgently to [place] for surgical intervention.  We will plan on removing her gallbladder today.  I have explained the procedure, including risks and complications, along with expected recovery with the patient, mom, and husband.  They understand and wish to proceed.  [name] 08/12/2012, 12:07 PM

## 2012-08-12 NOTE — Transfer of Care (Signed)
Immediate Anesthesia Transfer of Care Note  Patient: Stephanie [name]  Procedure(s) Performed: Procedure(s) (LRB): LAPAROSCOPIC CHOLECYSTECTOMY WITH INTRAOPERATIVE CHOLANGIOGRAM (N/A)  Patient Location: PACU  Anesthesia Type: General  Level of Consciousness: awake, alert , oriented and patient cooperative  Airway & Oxygen Therapy: Patient Spontanous Breathing and Patient connected to face mask oxygen  Post-op Assessment: Report given to PACU RN, Post -op Vital signs reviewed and stable and Patient moving all extremities X 4  Post vital signs: Reviewed and stable  Complications: No apparent anesthesia complications

## 2012-08-12 NOTE — ED Provider Notes (Signed)
History     CSN: [id number]  Arrival date & time 08/12/12  1036   First MD Initiated Contact with Patient 08/12/12 1103      Chief Complaint  Patient presents with  . Cholelithiasis    (Consider location/radiation/quality/duration/timing/severity/associated sxs/prior treatment) HPI Comments: Stephanie [name] is a 41 y.o. Female pt with right upper abdominal pain on and off for  "a while." Had a severe pain episode 2 days ago. Was seen by her PCP. Had outpatient US done yesterday which showed cholelithiasis with one gallstone lodged in the neck of the gallbladder. Pt was called today and was told to come to [facility] ER for Dr. Gerrit Friends to be seen. Pt reports no pain currently, no nausea, vomiting. NO fever, chills, malaise.   The history is provided by the patient.    Past Medical History  Diagnosis Date  . PONV (postoperative nausea and vomiting)   . Pinched nerve     left side-leg    Past Surgical History  Procedure Date  . Cyst on throat     at age 64  . Cesarean section 94/97/03  . Lumbar laminectomy/decompression microdiscectomy 02/10/2012    Procedure: LUMBAR LAMINECTOMY/DECOMPRESSION MICRODISCECTOMY 1 LEVEL;  Surgeon: Temple Pacini, MD;  Location: [facility];  Service: Neurosurgery;  Laterality: Left;  Left Lumbar Four-Five Laminectomy and Microdiskectomy    Family History  Problem Relation Age of Onset  . Anesthesia problems Mother     History  Substance Use Topics  . Smoking status: Never Smoker   . Smokeless tobacco: Not on file  . Alcohol Use: No    OB History    Grav Para Term Preterm Abortions TAB SAB Ect Mult Living                  Review of Systems  Constitutional: Negative for fever and chills.  Respiratory: Negative.   Cardiovascular: Negative.   Gastrointestinal: Positive for nausea and abdominal pain. Negative for vomiting.  All other systems reviewed and are negative.    Allergies  Morphine and related  Home Medications   Current Outpatient  Rx  Name Route Sig Dispense Refill  . NAPROXEN SODIUM 220 MG PO TABS Oral Take 220 mg by mouth 2 (two) times daily as needed. For pain.      BP 136/90  Pulse 80  Temp 98.4 F (36.9 C) (Oral)  Resp 16  SpO2 98%  LMP 07/12/2012  Physical Exam  Nursing note and vitals reviewed. Constitutional: She is oriented to person, place, and time. She appears well-developed and well-nourished. No distress.  Eyes: Conjunctivae are normal.  Neck: Neck supple.  Cardiovascular: Normal rate, regular rhythm and normal heart sounds.   Pulmonary/Chest: Effort normal and breath sounds normal. No respiratory distress. She has no wheezes. She has no rales.  Abdominal: Soft. Bowel sounds are normal. She exhibits no distension. There is no tenderness. There is no rebound.  Musculoskeletal: She exhibits no edema.  Neurological: She is alert and oriented to person, place, and time.  Skin: Skin is warm and dry.  Psychiatric: She has a normal mood and affect.    ED Course  Procedures (including critical care time)  11:10 AM Pt seen and evaluated by me. Pt was told to page Dr. Gerrit Friends upon arrival to ED. Spoke with  [place], [place], we apparently do not need to see pt. This will be screening note. They will take pt to OR.    1. Cholelithiasis  MDM          Lottie Mussel, PA 08/14/12 [phone]

## 2012-08-12 NOTE — Telephone Encounter (Signed)
Dr. Esmond Harps called stating this pt is in extreme discomfort from gallstone lodged in neck of GB. Reviewed with Dr Gerrit Friends on call. Dr Cliffton Asters advised per Dr Ardine Eng request to have pt come to [facility] ER. They are to have PA for our office paged when pt arrives. Tresa Endo our PA notified of pt coming to ER.

## 2012-08-12 NOTE — ED Notes (Signed)
A call placed to Dr. Ardine Eng physician line- [phone]. Was told that thaey would send a message letting Dr. Gerrit Friends know that the patient was in the ED room 20.

## 2012-08-12 NOTE — H&P (Signed)
General Surgery [facility] Patient seen and examined.  Now relatively symptom free.  Wishes to proceed with cholecystectomy.  The risks and benefits of the procedure have been discussed at length with the patient.  The patient understands the proposed procedure, potential alternative treatments, and the course of recovery to be expected.  All of the patient's questions have been answered at this time.  The patient wishes to proceed with surgery.  Velora Heckler, MD, [facility] Office: [phone]

## 2012-08-12 NOTE — Anesthesia Preprocedure Evaluation (Addendum)
Anesthesia Evaluation  Patient identified by MRN, date of birth, ID band Patient awake    Reviewed: Allergy & Precautions, H&P , NPO status , Patient's Chart, lab work & pertinent test results  History of Anesthesia Complications (+) PONV  Airway Mallampati: I TM Distance: >3 FB Neck ROM: Full    Dental No notable dental hx.    Pulmonary neg pulmonary ROS,  breath sounds clear to auscultation  Pulmonary exam normal       Cardiovascular negative cardio ROS  Rhythm:Regular Rate:Normal     Neuro/Psych negative neurological ROS  negative psych ROS   GI/Hepatic negative GI ROS, Neg liver ROS,   Endo/Other  negative endocrine ROSMorbid obesity  Renal/GU negative Renal ROS  negative genitourinary   Musculoskeletal negative musculoskeletal ROS (+)   Abdominal (+) + obese,   Peds negative pediatric ROS (+)  Hematology negative hematology ROS (+)   Anesthesia Other Findings   Reproductive/Obstetrics negative OB ROS                          Anesthesia Physical  Anesthesia Plan  ASA: II  Anesthesia Plan: General   Post-op Pain Management:    Induction: Intravenous  Airway Management Planned: Oral ETT  Additional Equipment:   Intra-op Plan:   Post-operative Plan: Extubation in OR  Informed Consent: I have reviewed the patients History and Physical, chart, labs and discussed the procedure including the risks, benefits and alternatives for the proposed anesthesia with the patient or authorized representative who has indicated his/her understanding and acceptance.   Dental advisory given  Plan Discussed with: CRNA and Surgeon  Anesthesia Plan Comments:        Anesthesia Quick Evaluation

## 2012-08-12 NOTE — ED Notes (Signed)
Called from Dr. Laurann Montana this am with results from GB U/S to see Dr. Gerrit Friends . abd pain, nausea-- no vomiting

## 2012-08-12 NOTE — ED Notes (Signed)
[id number]<YQ> Expected date:08/12/12<BR> Expected time:<BR> Means of arrival:<BR> Comments:<BR> Hold for Orlena Lamb--pt to see Dr. Gerrit Friends-- call him on arrival-- gall stones.

## 2012-08-12 NOTE — Anesthesia Postprocedure Evaluation (Signed)
  Anesthesia Post-op Note  Patient: Stephanie [name]  Procedure(s) Performed: Procedure(s) (LRB): LAPAROSCOPIC CHOLECYSTECTOMY WITH INTRAOPERATIVE CHOLANGIOGRAM (N/A)  Patient Location: PACU  Anesthesia Type: General  Level of Consciousness: awake and alert   Airway and Oxygen Therapy: Patient Spontanous Breathing  Post-op Pain: mild  Post-op Assessment: Post-op Vital signs reviewed, Patient's Cardiovascular Status Stable, Respiratory Function Stable, Patent Airway and No signs of Nausea or vomiting  Post-op Vital Signs: stable  Complications: No apparent anesthesia complications

## 2012-08-13 ENCOUNTER — Encounter (HOSPITAL_COMMUNITY): Payer: Self-pay | Admitting: Surgery

## 2012-08-13 MED ORDER — HYDROCODONE-ACETAMINOPHEN 5-325 MG PO TABS: 1.0000 | ORAL_TABLET | ORAL | Status: AC | PRN

## 2012-08-13 NOTE — Discharge Summary (Signed)
  Patient ID: Stephanie [name] MRN: [id number] DOB/AGE: Mar 13, 1971 41 y.o.  Admit date: 08/12/2012 Discharge date: 08/13/2012  Procedures: lap chole  Consults: None  Reason for Admission: This is a 41 yo female who was found to have a gallstone in the neck of the gallbladder.  She was referred to [facility] for [place] to evaluate.  Admission Diagnoses:  1.  Biliary colic  Hospital Course: the patient was admitted and she was taken to the operating room where she underwent a lap chole.  She tolerated it well.  Postoperatively, she tolerated a regular diet and her pain was well controlled.  She was otherwise stable for discharge home.  PE: Abd: soft, minimally tender, +BS, ND, incisions c/d/i  Discharge Diagnoses:  Principal Problem:  *Cholelithiasis s/p lap chole  Discharge Medications: Medication List  As of 08/13/2012  9:17 AM   TAKE these medications         HYDROcodone-acetaminophen 5-325 MG per tablet   Commonly known as: NORCO/VICODIN   Take 1-2 tablets by mouth every 4 (four) hours as needed.      naproxen sodium 220 MG tablet   Commonly known as: ANAPROX   Take 220 mg by mouth 2 (two) times daily as needed. For pain.            Discharge Instructions: Follow-up Information    Follow up with Velora Heckler, MD. Schedule an appointment as soon as possible for a visit in 3 weeks.   Contact information:   [address] [place] [address] [phone]          Signed: Letha Cape 08/13/2012, 9:17 AM

## 2012-08-13 NOTE — Discharge Summary (Signed)
General Surgery - [facility] Discharge today. Meline Russaw M. Babe Clenney, MD, FACS [facility] Office: [phone]   

## 2012-08-17 NOTE — ED Provider Notes (Signed)
Medical screening examination/treatment/procedure(s) were performed by non-physician practitioner and as supervising physician I was immediately available for consultation/collaboration.  Cyndra Numbers, MD 08/17/12 [phone]

## 2012-09-22 ENCOUNTER — Ambulatory Visit (INDEPENDENT_AMBULATORY_CARE_PROVIDER_SITE_OTHER): Payer: 59 | Admitting: Surgery

## 2012-09-22 ENCOUNTER — Encounter (INDEPENDENT_AMBULATORY_CARE_PROVIDER_SITE_OTHER): Payer: Self-pay | Admitting: Surgery

## 2012-09-22 VITALS — BP 118/72 | HR 84 | Temp 98.4°F | Resp 16 | Ht 64.0 in | Wt 238.6 lb

## 2012-09-22 DIAGNOSIS — K8020 Calculus of gallbladder without cholecystitis without obstruction: Secondary | ICD-10-CM

## 2012-09-22 NOTE — Patient Instructions (Signed)
  COCOA BUTTER & VITAMIN E CREAM  ([employer] or other brand)  Apply cocoa butter/vitamin E cream to your incision 2 - 3 times daily.  Massage cream into incision for one minute with each application.  Use sunscreen (50 SPF or higher) for first 6 months after surgery if area is exposed to sun.  You may substitute Mederma or other scar reducing creams as desired.   

## 2012-09-22 NOTE — Progress Notes (Signed)
General Surgery [facility]  Visit Diagnoses: 1. Cholelithiasis     HISTORY: The patient returns for her first postoperative visit having undergone laparoscopic cholecystectomy for symptomatic cholelithiasis. Final pathology showed chronic cholecystitis and cholelithiasis. Postoperative course has been uncomplicated.  EXAM: Surgical incisions are well-healed. No sign of herniation or infection. Palpation in the right upper quadrant shows no tenderness and no mass.  IMPRESSION: Status post laparoscopic cholecystectomy for chronic cholecystitis and cholelithiasis  PLAN: Patient is released to full activity without restriction. I have provided her with a copy of her pathology report. She will return to see me as needed.  Velora Heckler, MD, FACS General & Endocrine Surgery [facility]

## 2012-10-01 ENCOUNTER — Other Ambulatory Visit (HOSPITAL_COMMUNITY): Payer: Self-pay | Admitting: Interventional Radiology

## 2012-10-01 DIAGNOSIS — H9319 Tinnitus, unspecified ear: Secondary | ICD-10-CM

## 2012-10-01 DIAGNOSIS — R51 Headache: Secondary | ICD-10-CM

## 2012-12-03 ENCOUNTER — Ambulatory Visit (HOSPITAL_COMMUNITY): Payer: 59

## 2012-12-03 ENCOUNTER — Ambulatory Visit (HOSPITAL_COMMUNITY)
Admission: RE | Admit: 2012-12-03 | Discharge: 2012-12-03 | Disposition: A | Discharge status: Home / Self Care | Payer: 59 | Source: Ambulatory Visit | Attending: Interventional Radiology | Admitting: Interventional Radiology

## 2012-12-03 DIAGNOSIS — H9319 Tinnitus, unspecified ear: Secondary | ICD-10-CM | POA: Insufficient documentation

## 2012-12-03 DIAGNOSIS — J3489 Other specified disorders of nose and nasal sinuses: Secondary | ICD-10-CM | POA: Insufficient documentation

## 2012-12-03 DIAGNOSIS — R51 Headache: Secondary | ICD-10-CM | POA: Insufficient documentation

## 2012-12-03 LAB — CREATININE, SERUM
Creatinine, Ser: 0.55 mg/dL (ref 0.50–1.10)
GFR calc Af Amer: >90 mL/min (ref >90)
GFR calc non Af Amer: >90 mL/min (ref >90)

## 2012-12-03 MED ORDER — GADOBENATE DIMEGLUMINE 529 MG/ML IV SOLN
20.0000 mL | Freq: Once | INTRAVENOUS | Status: AC
  Administered 2012-12-03: 20 mL via INTRAVENOUS

## 2012-12-07 ENCOUNTER — Other Ambulatory Visit (HOSPITAL_COMMUNITY): Payer: Self-pay | Admitting: Interventional Radiology

## 2012-12-07 DIAGNOSIS — R51 Headache: Secondary | ICD-10-CM

## 2012-12-17 ENCOUNTER — Ambulatory Visit (HOSPITAL_COMMUNITY): Admission: RE | Admit: 2012-12-17 | Payer: 59 | Source: Ambulatory Visit

## 2012-12-30 ENCOUNTER — Ambulatory Visit (HOSPITAL_COMMUNITY)
Admission: RE | Admit: 2012-12-30 | Discharge: 2012-12-30 | Disposition: A | Discharge status: Home / Self Care | Payer: 59 | Source: Ambulatory Visit | Attending: Interventional Radiology | Admitting: Interventional Radiology

## 2012-12-30 DIAGNOSIS — R51 Headache: Secondary | ICD-10-CM

## 2014-01-02 ENCOUNTER — Telehealth (HOSPITAL_COMMUNITY): Payer: Self-pay | Admitting: Interventional Radiology

## 2014-01-02 NOTE — Telephone Encounter (Signed)
Called pt left VM for her to call me to schedule yearly f/u MRI/MRA JM

## 2014-04-11 ENCOUNTER — Other Ambulatory Visit: Payer: Self-pay | Admitting: Family Medicine

## 2014-04-11 DIAGNOSIS — R209 Unspecified disturbances of skin sensation: Secondary | ICD-10-CM

## 2014-04-11 DIAGNOSIS — M542 Cervicalgia: Secondary | ICD-10-CM

## 2014-04-17 ENCOUNTER — Ambulatory Visit
Admission: RE | Admit: 2014-04-17 | Discharge: 2014-04-17 | Disposition: A | Discharge status: Home / Self Care | Payer: 59 | Source: Ambulatory Visit | Attending: Family Medicine | Admitting: Family Medicine

## 2014-04-17 DIAGNOSIS — R209 Unspecified disturbances of skin sensation: Secondary | ICD-10-CM

## 2014-04-17 DIAGNOSIS — M542 Cervicalgia: Secondary | ICD-10-CM

## 2014-05-22 HISTORY — PX: ANTERIOR CERVICAL DECOMP/DISCECTOMY FUSION: SHX1161

## 2014-09-11 ENCOUNTER — Other Ambulatory Visit: Payer: Self-pay | Admitting: Obstetrics & Gynecology

## 2014-09-12 LAB — CYTOLOGY - PAP

## 2014-10-23 ENCOUNTER — Encounter (HOSPITAL_BASED_OUTPATIENT_CLINIC_OR_DEPARTMENT_OTHER): Payer: Self-pay | Admitting: *Deleted

## 2014-10-23 NOTE — Progress Notes (Signed)
NPO AFTER MN. ARRIVE AT 0600. NEEDS HG AND URINE PREG.  PRE-OP ORDERS PENDING. 

## 2014-10-24 ENCOUNTER — Telehealth (HOSPITAL_COMMUNITY): Payer: Self-pay | Admitting: Interventional Radiology

## 2014-10-24 NOTE — Telephone Encounter (Signed)
Called pt, left VM for her to call me back concerning her f/u MRI, MRA, and MRV study that is due. JM

## 2014-10-25 ENCOUNTER — Other Ambulatory Visit (HOSPITAL_COMMUNITY): Payer: Self-pay | Admitting: Interventional Radiology

## 2014-10-25 DIAGNOSIS — R519 Headache, unspecified: Secondary | ICD-10-CM

## 2014-10-25 DIAGNOSIS — H9319 Tinnitus, unspecified ear: Secondary | ICD-10-CM

## 2014-10-25 DIAGNOSIS — R51 Headache: Secondary | ICD-10-CM

## 2014-10-25 NOTE — Anesthesia Preprocedure Evaluation (Addendum)
Anesthesia Evaluation  Patient identified by MRN, date of birth, ID band Patient awake    Reviewed: Allergy & Precautions, H&P , NPO status , Patient's Chart, lab work & pertinent test results  History of Anesthesia Complications (+) PONV  Airway Mallampati: I  TM Distance: >3 FB Neck ROM: Full    Dental no notable dental hx. (+) Teeth Intact, Dental Advisory Given   Pulmonary neg pulmonary ROS,  breath sounds clear to auscultation  Pulmonary exam normal       Cardiovascular negative cardio ROS  Rhythm:Regular Rate:Normal     Neuro/Psych Cervical fusion negative neurological ROS  negative psych ROS   GI/Hepatic negative GI ROS, Neg liver ROS,   Endo/Other  negative endocrine ROSMorbid obesity  Renal/GU negative Renal ROS  negative genitourinary   Musculoskeletal negative musculoskeletal ROS (+)   Abdominal (+) + obese,   Peds negative pediatric ROS (+)  Hematology negative hematology ROS (+)   Anesthesia Other Findings   Reproductive/Obstetrics negative OB ROS                           Anesthesia Physical Anesthesia Plan  ASA: III  Anesthesia Plan: General   Post-op Pain Management:    Induction: Intravenous  Airway Management Planned: Oral ETT  Additional Equipment:   Intra-op Plan:   Post-operative Plan: Extubation in OR  Informed Consent:   Plan Discussed with: Surgeon  Anesthesia Plan Comments:       Anesthesia Quick Evaluation

## 2014-10-25 NOTE — H&P (Signed)
Stephanie [name], Stephanie [name]                ACCOUNT NO.:  [id number]  MEDICAL RECORD NO.:  [id number]  LOCATION:                                 FACILITY:  PHYSICIAN:  Maisie Fus, M.D.   DATE OF BIRTH:  06-30-1971  DATE OF ADMISSION: DATE OF DISCHARGE:                             HISTORY & PHYSICAL   Date of admission to outpatient surgery is October 26, 2014.  ADMITTING DIAGNOSES:  Multiparity, request for surgical sterilization, history of menorrhagia.  The patient is a 43 year old, with 3 living children who has been using Mirena for contraception for the last 5 years.  In addition to a contraceptive treatment for menorrhagia, she does not wish to have another Mirena and she is ready for permanent sterilization.  She is admitted at this time for laparoscopic tubal ligation, for hysteroscopy D and C, and removal of IUD to be followed by NovaSure endometrial ablation.  Her current review of systems is negative.  PAST MEDICAL HISTORY:  No known significant medical illnesses.  She has no known allergies to medications.  She is not currently on any medications on a regular basis.  She does not use cigarettes or alcohol.  PREVIOUS SURGICAL PROCEDURES:  Cesarean deliveries in 1994, 1997, and 2003.  She had back surgery and gallbladder surgery in 2013.  She had neck surgery in 2015.  FAMILY HISTORY:  Noncontributory.  PHYSICAL EXAMINATION:  HEENT:  Grossly within normal limits. VITAL SIGNS:  Blood pressure in the office 140/100.  Her weight is 250. Height 5 feet 4 inches. GENERAL:  She is alert, oriented, and cooperative. NECK:  Thyroid gland is not palpably enlarged. CHEST:  Clear to auscultation. HEART:  Normal sinus rhythm without murmurs, rubs, or gallops. ABDOMEN:  Soft and nontender without appreciable organomegaly or palpable masses. BREASTS:  Breast exam was considered to be normal.  She had a normal mammogram on September 11, 2014. PELVIC:  External genitalia,  vagina, and cervix are normal to inspection.  The bimanual exam reveals uterus to be anterior in position.  There is a string palpable in the os.  There are no palpable adnexal masses.  The rectum is palpably normal and rectovaginal exam confirms the above findings. EXTREMITIES:  Without cyanosis, clubbing, or edema.  ASSESSMENT:  Multiparity, expiration of Mirena, request for voluntary sterilization, history of menorrhagia, and request for endometrial ablation.     Maisie Fus, M.D.     WRN/MEDQ  D:  10/23/2014  T:  10/24/2014  Job:  [id number]

## 2014-10-26 ENCOUNTER — Encounter (HOSPITAL_BASED_OUTPATIENT_CLINIC_OR_DEPARTMENT_OTHER)
Admission: RE | Disposition: A | Discharge status: Home / Self Care | Payer: Self-pay | Source: Ambulatory Visit | Attending: Obstetrics & Gynecology

## 2014-10-26 ENCOUNTER — Ambulatory Visit (HOSPITAL_BASED_OUTPATIENT_CLINIC_OR_DEPARTMENT_OTHER): Payer: 59 | Admitting: Anesthesiology

## 2014-10-26 ENCOUNTER — Encounter (HOSPITAL_BASED_OUTPATIENT_CLINIC_OR_DEPARTMENT_OTHER): Payer: Self-pay | Admitting: *Deleted

## 2014-10-26 ENCOUNTER — Ambulatory Visit (HOSPITAL_BASED_OUTPATIENT_CLINIC_OR_DEPARTMENT_OTHER)
Admission: RE | Admit: 2014-10-26 | Discharge: 2014-10-26 | Disposition: A | Discharge status: Home / Self Care | Payer: 59 | Source: Ambulatory Visit | Attending: Obstetrics & Gynecology | Admitting: Obstetrics & Gynecology

## 2014-10-26 DIAGNOSIS — N920 Excessive and frequent menstruation with regular cycle: Secondary | ICD-10-CM | POA: Insufficient documentation

## 2014-10-26 DIAGNOSIS — N736 Female pelvic peritoneal adhesions (postinfective): Secondary | ICD-10-CM | POA: Insufficient documentation

## 2014-10-26 DIAGNOSIS — Z30432 Encounter for removal of intrauterine contraceptive device: Secondary | ICD-10-CM | POA: Diagnosis not present

## 2014-10-26 DIAGNOSIS — Z6841 Body Mass Index (BMI) 40.0 and over, adult: Secondary | ICD-10-CM | POA: Insufficient documentation

## 2014-10-26 DIAGNOSIS — Z302 Encounter for sterilization: Secondary | ICD-10-CM | POA: Diagnosis not present

## 2014-10-26 DIAGNOSIS — Z885 Allergy status to narcotic agent status: Secondary | ICD-10-CM | POA: Diagnosis not present

## 2014-10-26 DIAGNOSIS — N9971 Accidental puncture and laceration of a genitourinary system organ or structure during a genitourinary system procedure: Secondary | ICD-10-CM | POA: Diagnosis not present

## 2014-10-26 DIAGNOSIS — D251 Intramural leiomyoma of uterus: Secondary | ICD-10-CM | POA: Insufficient documentation

## 2014-10-26 HISTORY — PX: DILITATION & CURRETTAGE/HYSTROSCOPY WITH NOVASURE ABLATION: SHX5568

## 2014-10-26 HISTORY — PX: LAPAROSCOPIC TUBAL LIGATION: SHX1937

## 2014-10-26 HISTORY — DX: Excessive and frequent menstruation with regular cycle: N92.0

## 2014-10-26 HISTORY — DX: Presence of spectacles and contact lenses: Z97.3

## 2014-10-26 LAB — CBC
HCT: 41.2 % (ref 36.0–46.0)
HEMOGLOBIN: 13.3 g/dL (ref 12.0–15.0)
MCH: 29.2 pg (ref 26.0–34.0)
MCHC: 32.3 g/dL (ref 30.0–36.0)
MCV: 90.4 fL (ref 78.0–100.0)
PLATELETS: 345 10*3/uL (ref 150–400)
RBC: 4.56 MIL/uL (ref 3.87–5.11)
RDW: 13.7 % (ref 11.5–15.5)
WBC: 6.6 10*3/uL (ref 4.0–10.5)

## 2014-10-26 LAB — POCT PREGNANCY, URINE: Preg Test, Ur: NEGATIVE

## 2014-10-26 LAB — POCT HEMOGLOBIN-HEMACUE: HEMOGLOBIN: 13.6 g/dL (ref 12.0–15.0)

## 2014-10-26 SURGERY — DILATATION & CURETTAGE/HYSTEROSCOPY WITH NOVASURE ABLATION
Anesthesia: General | Site: Uterus

## 2014-10-26 MED ORDER — CEFAZOLIN SODIUM-DEXTROSE 2-3 GM-% IV SOLR
INTRAVENOUS | Status: AC
  Filled 2014-10-26: qty 50

## 2014-10-26 MED ORDER — FENTANYL CITRATE 0.05 MG/ML IJ SOLN
INTRAMUSCULAR | Status: AC
  Filled 2014-10-26: qty 2

## 2014-10-26 MED ORDER — NEOSTIGMINE METHYLSULFATE 10 MG/10ML IV SOLN
INTRAVENOUS | Status: DC | PRN
  Administered 2014-10-26: 3 mg via INTRAVENOUS

## 2014-10-26 MED ORDER — FENTANYL CITRATE 0.05 MG/ML IJ SOLN
INTRAMUSCULAR | Status: DC | PRN
  Administered 2014-10-26 (×2): 50 ug via INTRAVENOUS
  Administered 2014-10-26: 100 ug via INTRAVENOUS

## 2014-10-26 MED ORDER — ROCURONIUM BROMIDE 100 MG/10ML IV SOLN
INTRAVENOUS | Status: DC | PRN
  Administered 2014-10-26: 50 mg via INTRAVENOUS

## 2014-10-26 MED ORDER — LACTATED RINGERS IV SOLN
INTRAVENOUS | Status: DC
  Administered 2014-10-26 (×2): via INTRAVENOUS
  Filled 2014-10-26: qty 1000

## 2014-10-26 MED ORDER — FENTANYL CITRATE 0.05 MG/ML IJ SOLN
INTRAMUSCULAR | Status: AC
  Filled 2014-10-26: qty 4

## 2014-10-26 MED ORDER — CEFAZOLIN SODIUM-DEXTROSE 2-3 GM-% IV SOLR
2.0000 g | INTRAVENOUS | Status: AC
  Administered 2014-10-26: 2 g via INTRAVENOUS
  Filled 2014-10-26: qty 50

## 2014-10-26 MED ORDER — LACTATED RINGERS IR SOLN
Status: DC | PRN
  Administered 2014-10-26: 3000 mL

## 2014-10-26 MED ORDER — SCOPOLAMINE 1 MG/3DAYS TD PT72
1.0000 | MEDICATED_PATCH | TRANSDERMAL | Status: AC
  Administered 2014-10-26: 1 via TRANSDERMAL
  Filled 2014-10-26: qty 1

## 2014-10-26 MED ORDER — DEXAMETHASONE SODIUM PHOSPHATE 10 MG/ML IJ SOLN
INTRAMUSCULAR | Status: DC | PRN
  Administered 2014-10-26: 10 mg via INTRAVENOUS

## 2014-10-26 MED ORDER — ACETAMINOPHEN 10 MG/ML IV SOLN
INTRAVENOUS | Status: DC | PRN
  Administered 2014-10-26: 1000 mg via INTRAVENOUS

## 2014-10-26 MED ORDER — PROPOFOL INFUSION 10 MG/ML OPTIME
INTRAVENOUS | Status: DC | PRN
  Administered 2014-10-26 (×2): 50 mL via INTRAVENOUS
  Administered 2014-10-26: 200 mL via INTRAVENOUS

## 2014-10-26 MED ORDER — LACTATED RINGERS IV SOLN
INTRAVENOUS | Status: DC
  Administered 2014-10-26: 07:00:00 via INTRAVENOUS
  Filled 2014-10-26: qty 1000

## 2014-10-26 MED ORDER — BUPIVACAINE HCL (PF) 0.25 % IJ SOLN: INTRAMUSCULAR | Status: DC | PRN

## 2014-10-26 MED ORDER — ONDANSETRON HCL 4 MG/2ML IJ SOLN
INTRAMUSCULAR | Status: DC | PRN
  Administered 2014-10-26: 4 mg via INTRAVENOUS

## 2014-10-26 MED ORDER — MIDAZOLAM HCL 2 MG/2ML IJ SOLN
INTRAMUSCULAR | Status: AC
  Filled 2014-10-26: qty 2

## 2014-10-26 MED ORDER — LIDOCAINE HCL (CARDIAC) 20 MG/ML IV SOLN
INTRAVENOUS | Status: DC | PRN
  Administered 2014-10-26: 100 mg via INTRAVENOUS

## 2014-10-26 MED ORDER — OXYCODONE HCL 5 MG PO TABS
5.0000 mg | ORAL_TABLET | Freq: Once | ORAL | Status: AC
  Administered 2014-10-26: 5 mg via ORAL
  Filled 2014-10-26: qty 1

## 2014-10-26 MED ORDER — MIDAZOLAM HCL 5 MG/5ML IJ SOLN
INTRAMUSCULAR | Status: DC | PRN
  Administered 2014-10-26: 2 mg via INTRAVENOUS

## 2014-10-26 MED ORDER — OXYCODONE HCL 5 MG PO TABS
ORAL_TABLET | ORAL | Status: AC
  Filled 2014-10-26: qty 1

## 2014-10-26 MED ORDER — SCOPOLAMINE 1 MG/3DAYS TD PT72
MEDICATED_PATCH | TRANSDERMAL | Status: AC
  Filled 2014-10-26: qty 1

## 2014-10-26 MED ORDER — LACTATED RINGERS IV SOLN
INTRAVENOUS | Status: DC
  Filled 2014-10-26: qty 1000

## 2014-10-26 MED ORDER — FENTANYL CITRATE 0.05 MG/ML IJ SOLN
25.0000 ug | INTRAMUSCULAR | Status: DC | PRN
  Administered 2014-10-26 (×2): 50 ug via INTRAVENOUS
  Filled 2014-10-26: qty 1

## 2014-10-26 MED ORDER — KETOROLAC TROMETHAMINE 30 MG/ML IJ SOLN
INTRAMUSCULAR | Status: DC | PRN
  Administered 2014-10-26: 30 mg via INTRAVENOUS

## 2014-10-26 MED ORDER — SUCCINYLCHOLINE CHLORIDE 20 MG/ML IJ SOLN
INTRAMUSCULAR | Status: DC | PRN
  Administered 2014-10-26: 160 mg via INTRAVENOUS

## 2014-10-26 MED ORDER — BUPIVACAINE HCL (PF) 0.5 % IJ SOLN
INTRAMUSCULAR | Status: DC | PRN
  Administered 2014-10-26: 3 mL

## 2014-10-26 MED ORDER — GLYCOPYRROLATE 0.2 MG/ML IJ SOLN
INTRAMUSCULAR | Status: DC | PRN
  Administered 2014-10-26: 0.4 mg via INTRAVENOUS

## 2014-10-26 SURGICAL SUPPLY — 75 items
ABLATOR ENDOMETRIAL BIPOLAR (ABLATOR) ×2 IMPLANT
ADH SKN CLS APL DERMABOND .7 (GAUZE/BANDAGES/DRESSINGS)
APPLICATOR COTTON TIP 6IN STRL (MISCELLANEOUS) ×4 IMPLANT
BAG SPEC RTRVL LRG 6X4 10 (ENDOMECHANICALS)
BAG URINE LEG 500ML (DRAIN) IMPLANT
BANDAGE ADHESIVE 1X3 (GAUZE/BANDAGES/DRESSINGS) IMPLANT
BLADE CLIPPER SURG (BLADE) IMPLANT
BLADE SURG 15 STRL LF DISP TIS (BLADE) ×2 IMPLANT
BLADE SURG 15 STRL SS (BLADE) ×4
CANISTER SUCTION 2500CC (MISCELLANEOUS) ×4 IMPLANT
CATH FOLEY 2WAY SLVR  5CC 14FR (CATHETERS)
CATH FOLEY 2WAY SLVR 5CC 14FR (CATHETERS) ×2 IMPLANT
CATH ROBINSON RED A/P 16FR (CATHETERS) ×4 IMPLANT
CLOSURE WOUND 1/4 X3 (GAUZE/BANDAGES/DRESSINGS) ×1
CLOSURE WOUND 1/4X4 (GAUZE/BANDAGES/DRESSINGS) ×1
CLOTH BEACON ORANGE TIMEOUT ST (SAFETY) ×4 IMPLANT
COVER TABLE BACK 60X90 (DRAPES) ×4 IMPLANT
DERMABOND ADVANCED (GAUZE/BANDAGES/DRESSINGS)
DERMABOND ADVANCED .7 DNX12 (GAUZE/BANDAGES/DRESSINGS) IMPLANT
DRAPE CAMERA CLOSED 9X96 (DRAPES) ×2 IMPLANT
DRAPE LG THREE QUARTER DISP (DRAPES) ×4 IMPLANT
DRAPE UNDERBUTTOCKS STRL (DRAPE) ×4 IMPLANT
DRSG TEGADERM 2-3/8X2-3/4 SM (GAUZE/BANDAGES/DRESSINGS) IMPLANT
DRSG TEGADERM 4X4.75 (GAUZE/BANDAGES/DRESSINGS) IMPLANT
ELECT REM PT RETURN 9FT ADLT (ELECTROSURGICAL) ×4
ELECTRODE REM PT RTRN 9FT ADLT (ELECTROSURGICAL) ×2 IMPLANT
FILTER SMOKE EVAC LAPAROSHD (FILTER) IMPLANT
GLOVE BIO SURGEON STRL SZ7.5 (GLOVE) ×8 IMPLANT
GOWN PREVENTION PLUS LG XLONG (DISPOSABLE) ×4 IMPLANT
GOWN PREVENTION PLUS XLARGE (GOWN DISPOSABLE) ×2 IMPLANT
GOWN STRL REIN XL XLG (GOWN DISPOSABLE) ×2 IMPLANT
GOWN STRL REUS W/ TWL LRG LVL3 (GOWN DISPOSABLE) IMPLANT
GOWN STRL REUS W/TWL LRG LVL3 (GOWN DISPOSABLE) ×4
GOWN STRL REUS W/TWL XL LVL3 (GOWN DISPOSABLE) ×2 IMPLANT
LEGGING LITHOTOMY PAIR STRL (DRAPES) ×2 IMPLANT
LOOP ANGLED CUTTING 22FR (CUTTING LOOP) IMPLANT
NDL HYPO 25X1 1.5 SAFETY (NEEDLE) ×2 IMPLANT
NDL INSUFFLATION 14GA 120MM (NEEDLE) ×2 IMPLANT
NEEDLE HYPO 25X1 1.5 SAFETY (NEEDLE) ×4 IMPLANT
NEEDLE INSUFFLATION 14GA 120MM (NEEDLE) ×4 IMPLANT
NS IRRIG 1000ML POUR BTL (IV SOLUTION) ×2 IMPLANT
NS IRRIG 500ML POUR BTL (IV SOLUTION) ×4 IMPLANT
PACK BASIN DAY SURGERY FS (CUSTOM PROCEDURE TRAY) ×4 IMPLANT
PACK LAPAROSCOPY II (CUSTOM PROCEDURE TRAY) ×2 IMPLANT
PAD OB MATERNITY 4.3X12.25 (PERSONAL CARE ITEMS) ×4 IMPLANT
PAD PREP 24X48 CUFFED NSTRL (MISCELLANEOUS) ×4 IMPLANT
PENCIL BUTTON HOLSTER BLD 10FT (ELECTRODE) IMPLANT
POUCH SPECIMEN RETRIEVAL 10MM (ENDOMECHANICALS) IMPLANT
SCISSORS LAP 5X35 DISP (ENDOMECHANICALS) ×2 IMPLANT
SEALER TISSUE G2 CVD JAW 45CM (ENDOMECHANICALS) IMPLANT
SET IRRIG TUBING LAPAROSCOPIC (IRRIGATION / IRRIGATOR) ×2 IMPLANT
SET TUBING HYSTEROSCOPY 2 NDL (TUBING) ×4 IMPLANT
SHEET LAVH (DRAPES) ×2 IMPLANT
SOLUTION ANTI FOG 6CC (MISCELLANEOUS) ×4 IMPLANT
SPONGE GAUZE 2X2 8PLY STER LF (GAUZE/BANDAGES/DRESSINGS) ×1
SPONGE GAUZE 2X2 8PLY STRL LF (GAUZE/BANDAGES/DRESSINGS) ×1 IMPLANT
SPONGE GAUZE 4X4 12PLY (GAUZE/BANDAGES/DRESSINGS) IMPLANT
STRIP CLOSURE SKIN 1/4X3 (GAUZE/BANDAGES/DRESSINGS) ×3 IMPLANT
STRIP CLOSURE SKIN 1/4X4 (GAUZE/BANDAGES/DRESSINGS) ×3 IMPLANT
SUT VIC AB 3-0 PS2 18 (SUTURE) ×4
SUT VIC AB 3-0 PS2 18XBRD (SUTURE) ×2 IMPLANT
SUT VICRYL 0 UR6 27IN ABS (SUTURE) IMPLANT
SYR 3ML 23GX1 SAFETY (SYRINGE) IMPLANT
SYR CONTROL 10ML LL (SYRINGE) ×6 IMPLANT
SYRINGE 10CC LL (SYRINGE) ×4 IMPLANT
TOWEL OR 17X24 6PK STRL BLUE (TOWEL DISPOSABLE) ×8 IMPLANT
TRAY DSU PREP LF (CUSTOM PROCEDURE TRAY) ×4 IMPLANT
TROCAR OPTI TIP 5M 100M (ENDOMECHANICALS) ×2 IMPLANT
TROCAR XCEL DIL TIP R 11M (ENDOMECHANICALS) ×6 IMPLANT
TROCAR Z-THREAD BLADED 11X100M (TROCAR) IMPLANT
TROCAR Z-THREAD BLADED 5X100MM (TROCAR) IMPLANT
TUBE HYSTEROSCOPY W Y-CONNECT (TUBING) ×4 IMPLANT
TUBING INSUFFLATION 10FT LAP (TUBING) ×4 IMPLANT
WATER STERILE IRR 1000ML POUR (IV SOLUTION) ×4 IMPLANT
WATER STERILE IRR 500ML POUR (IV SOLUTION) ×4 IMPLANT

## 2014-10-26 NOTE — Anesthesia Postprocedure Evaluation (Signed)
  Anesthesia Post-op Note  Patient: Stephanie [name]  Procedure(s) Performed: Procedure(s) (LRB): DILATATION & CURETTAGE/HYSTEROSCOPY WITH ATTEMPTED NOVASURE ABLATION WITH IUD REMOVAL (N/A) LAPAROSCOPIC TUBAL LIGATION (Bilateral)  Patient Location: PACU  Anesthesia Type: General  Level of Consciousness: awake and alert   Airway and Oxygen Therapy: Patient Spontanous Breathing  Post-op Pain: mild  Post-op Assessment: Post-op Vital signs reviewed, Patient's Cardiovascular Status Stable, Respiratory Function Stable, Patent Airway and No signs of Nausea or vomiting  Last Vitals:  Filed Vitals:   10/26/14 0930  BP: 124/75  Pulse: 72  Temp:   Resp: 16    Post-op Vital Signs: stable   Complications: No apparent anesthesia complications

## 2014-10-26 NOTE — Transfer of Care (Signed)
Immediate Anesthesia Transfer of Care Note  Patient: Stephanie [name]  Procedure(s) Performed: Procedure(s): DILATATION & CURETTAGE/HYSTEROSCOPY WITH NOVASURE ABLATION WITH IUD REMOVAL (N/A) LAPAROSCOPIC TUBAL LIGATION (Bilateral)  Patient Location: PACU  Anesthesia Type:General  Level of Consciousness: awake, alert , oriented and patient cooperative  Airway & Oxygen Therapy: Patient Spontanous Breathing and Patient connected to nasal cannula oxygen  Post-op Assessment: Report given to PACU RN and Post -op Vital signs reviewed and stable  Post vital signs: Reviewed and stable  Complications: No apparent anesthesia complications

## 2014-10-26 NOTE — Anesthesia Procedure Notes (Signed)
Procedure Name: Intubation Date/Time: 10/26/2014 7:40 AM Performed by: Wanita Chamberlain Pre-anesthesia Checklist: Patient identified, Timeout performed, Emergency Drugs available, Suction available and Patient being monitored Patient Re-evaluated:Patient Re-evaluated prior to inductionOxygen Delivery Method: Circle system utilized Preoxygenation: Pre-oxygenation with 100% oxygen Intubation Type: IV induction Ventilation: Mask ventilation without difficulty Grade View: Grade I Tube type: Oral Tube size: 7.0 mm Number of attempts: 1 Airway Equipment and Method: Patient positioned with wedge pillow,  Bite block and Stylet Secured at: 22 cm Tube secured with: Tape Dental Injury: Teeth and Oropharynx as per pre-operative assessment

## 2014-10-26 NOTE — Discharge Instructions (Signed)
Laparoscopic Tubal Ligation Care After Refer to this sheet in the next few weeks. These instructions provide you with information on caring for yourself after your procedure. Your caregiver may also give you more specific instructions. Your treatment has been planned according to current medical practices, but problems sometimes occur. Call your caregiver if you have any problems or questions after your procedure. HOME CARE INSTRUCTIONS   Rest the remainder of the day.  Only take over-the-counter or prescription medicines for pain, discomfort, or fever as directed by your caregiver. Do not take aspirin. It can cause bleeding.  Gradually resume daily activities, diet, rest, driving, and work.  Avoid sexual intercourse for 2 weeks or as directed.  Do not use tampons or douche.  Do not drive while taking pain medicine.  Do not lift anything over 5 pounds for 2 weeks or as directed.  Only take showers, not baths, until you are seen by your caregiver.  Change bandages (dressings) as directed.  Take your temperature twice a day and record it.  Try to have help for the first 7 to 10 days for your household needs.  Return to your caregiver to get your stitches (sutures) removed and for follow-up visits as directed. SEEK MEDICAL CARE IF:   You have redness, swelling, or increasing pain in a wound.  You have drainage from a wound lasting longer than 1 day.  Your pain is getting worse.  You have a rash.  You become dizzy or lightheaded.  You have a reaction to your medicine.  You need stronger medicine or a change in your pain medicine.  You notice a bad smell coming from a wound or dressing.  Your wound breaks open after the sutures have been removed.  You are constipated. SEEK IMMEDIATE MEDICAL CARE IF:   You faint.  You have a fever.  You have increasing abdominal pain.  You have severe pain in your shoulders.  You have bleeding or drainage from the suture sites or  vagina following surgery.  You have shortness of breath or difficulty breathing.  You have chest or leg pain.  You have persistent nausea, vomiting, or diarrhea. MAKE SURE YOU:   Understand these instructions.  Watch your condition.  Get help right away if you are not doing well or get worse. Document Released: 06/27/2005 Document Revised: 06/08/2012 Document Reviewed: 03/20/2012 [facility] Patient Information 2015 [place], [place]. This information is not intended to replace advice given to you by your health care provider. Make sure you discuss any questions you have with your health care provider.    D & C Home care Instructions:   Personal hygiene:  Used sanitary napkins for vaginal drainage not tampons. Shower or tub bathe the day after your procedure. No douching until bleeding stops. Always wipe from front to back after  Elimination.  Activity: Do not drive or operate any equipment today. The effects of the anesthesia are still present and drowsiness may result. Rest today, not necessarily flat bed rest, just take it easy. You may resume your normal activity in one to 2 days.  Sexual activity: No intercourse for one week or as indicated by your physician  Diet: Eat a light diet as desired this evening. You may resume a regular diet tomorrow.  Return to work: One to 2 days.  General Expectations of your surgery: Vaginal bleeding should be no heavier than a normal period. Spotting may continue up to 10 days. Mild cramps may continue for a couple of days. You  may have a regular period in 2-6 weeks.  Unexpected observations call your doctor if these occur: persistent or heavy bleeding. Severe abdominal cramping or pain. Elevation of temperature greater than 100F.  Call for an appointment in one week.    Patient's Signature_______________________________________________________  Nurse's Signature________________________________________________________    Post Anesthesia  Home Care Instructions  Activity: Get plenty of rest for the remainder of the day. A responsible adult should stay with you for 24 hours following the procedure.  For the next 24 hours, DO NOT: -Drive a car -Paediatric nurse -Drink alcoholic beverages -Take any medication unless instructed by your physician -Make any legal decisions or sign important papers.  Meals: Start with liquid foods such as gelatin or soup. Progress to regular foods as tolerated. Avoid greasy, spicy, heavy foods. If nausea and/or vomiting occur, drink only clear liquids until the nausea and/or vomiting subsides. Call your physician if vomiting continues.  Special Instructions/Symptoms: Your throat may feel dry or sore from the anesthesia or the breathing tube placed in your throat during surgery. If this causes discomfort, gargle with warm salt water. The discomfort should disappear within 24 hours.

## 2014-10-27 ENCOUNTER — Encounter (HOSPITAL_BASED_OUTPATIENT_CLINIC_OR_DEPARTMENT_OTHER): Payer: Self-pay | Admitting: Obstetrics & Gynecology

## 2014-10-29 NOTE — Op Note (Signed)
Stephanie [name], Stephanie [name]                ACCOUNT NO.:  [id number]  MEDICAL RECORD NO.:  [id number]  LOCATION:                                 FACILITY:  PHYSICIAN:  Maisie Fus, M.D.   DATE OF BIRTH:  08-Dec-1971  DATE OF PROCEDURE:  10/26/2014 DATE OF DISCHARGE:                              OPERATIVE REPORT   PREOPERATIVE DIAGNOSES:  History of menorrhagia, multiparity, upcoming exploration of Mirena intrauterine device, request for surgical sterilization, and removal of intrauterine device, and endometrial ablation.  POSTOPERATIVE DIAGNOSES:  History of menorrhagia, multiparity, upcoming exploration of Mirena intrauterine device, request for surgical sterilization, and removal of intrauterine device, and endometrial ablation with numerous uterine leiomyomata including irregularity of the posterior endometrial surface with intramural myomas, pelvic adhesions, normal fallopian tubes, and ovaries.  PROCEDURE:  Laparoscopic tubal sterilization with bipolar fulguration and sharp division of fallopian tubes followed by hysteroscopy, D and C, and attempted, but failed NovaSure endometrial ablation after removal of an IUD.  INTRAOPERATIVE COMPLICATIONS:  Uterine perforation, without hemorrhage.  Details of the patient's present illness are recorded in the admission note.  She was admitted on the morning of surgery.  She was given a bolus of Ancef.  She was brought to the operating room, placed in PAS hose.  She was placed in dorsal lithotomy position using the [employer] system. The abdomen, perineum, and vagina were prepped and draped in the usual fashion after induction of general anesthesia.  During the prep, the bladder was evacuated with Quentin Cornwall catheter and Hulka tenaculum was attached to the cervix and uterus without difficulty.  IUD string could not be visualized and therefore the IUD was removed later during the procedure.  After application of the sterile drapes, a small  incision was made at the umbilicus.  The anterior abdominal wall was manually elevated, and a Veress needle introduced without difficulty. Pneumoperitoneum was allowed to accumulate using carbon dioxide gas. The bipolar Kleppinger forceps __________ through the operating channel of the laparoscope.  Systematic examination of abdominal and pelvic contents was carried out.  Body habitus did preclude or make the inspection a little more difficult.  The uterus was large and could not be elevated out of the pelvis.  Upper abdomen appeared to be normal with a normal-appearing liver.  The fallopian tubes and ovaries were both normal.  There were some filmy adhesions around the fallopian tubes, but none occluding the fimbria.  The isthmic portion of each tube was fulgurated with bipolar forceps and then divided sharply using blunt- tipped __________ scissors.  Bleeding encountered on the left was then controlled with Kleppinger and hemostasis was confirmed.  Attention was then turned to the vaginal portion of the procedure.  The Hulka tenaculum was removed.  The bivalve speculum was introduced.  The cervix was grasped on the anterior lip with a single-tooth tenaculum.  A small Pratt dilator was introduced to open the cervix and using the Randall stone forceps, the IUD was recovered.  The hysteroscope was then introduced using lactated Ringer's as distending medium and the cavity was evaluated.  The posterior fundal surface of the endometrium was slightly irregular with some minimal protrusion of myomas into  the posterior cavity.  Gentle thorough curettage was carried out followed by exploration with Randall stone forceps to recover the tissue.  The uterus sounded to 10.5 cm.  The cervix sounded to 4, this made the cavity depth 6.5 cm.  The NovaSure device was then loaded without difficulty.  Cavity width was determined to be approximately 4.3 to 4.5 cm.  Attempts to fire this device was not  accomplished.  A second device was then loaded, similar numbers were utilized, and that is what the findings were.  This device did fire, but only coagulated for a period of 20 seconds.  Finally, a third device was used and on placement of this device, the cavity width was measured at 3.2 cm.  This prompted reinspection with the hysteroscope and it appeared that this particular device perforated the posterior fundal surface on either side.  There was no active bleeding from the lesions.  The procedure at this point was terminated since the device cannot be fired in this circumstance. It was elected to keep the patient for several hours in the postoperative recovery area.  After approximately 4-1/2 hours, I revisited the recovery area, the patient was alert and oriented.  Vital signs had remained stable.  She was not complaining of any significant abdominal pain, had no peritoneal symptoms, and at this point she was cautioned about heavy bleeding, was discharged home with my cell phone number to call with any complications.  The plan is to see her for followup on Monday morning at 9:30, which will be approximately 3 days postop.  There were no other complications.     Maisie Fus, M.D.     WRN/MEDQ  D:  10/26/2014  T:  10/26/2014  Job:  [id number]

## 2014-11-08 ENCOUNTER — Telehealth (HOSPITAL_COMMUNITY): Payer: Self-pay | Admitting: Interventional Radiology

## 2014-11-08 NOTE — Telephone Encounter (Signed)
Pt requested a Xanax tablet to help her relax for her MRI/MRA. Per Deveshwar - I called in Xanax .25mg  1 tablet with 0 refills to take 1 hour prior to imaging study. Called pt gave her these instructions. She was in complete understanding and agreement of this. JM

## 2014-11-09 ENCOUNTER — Ambulatory Visit (HOSPITAL_COMMUNITY): Admission: RE | Admit: 2014-11-09 | Payer: 59 | Source: Ambulatory Visit

## 2014-11-09 ENCOUNTER — Ambulatory Visit (HOSPITAL_COMMUNITY)
Admission: RE | Admit: 2014-11-09 | Discharge: 2014-11-09 | Disposition: A | Discharge status: Home / Self Care | Payer: 59 | Source: Ambulatory Visit | Attending: Interventional Radiology | Admitting: Interventional Radiology

## 2014-11-09 DIAGNOSIS — J988 Other specified respiratory disorders: Secondary | ICD-10-CM | POA: Insufficient documentation

## 2014-11-09 DIAGNOSIS — H9319 Tinnitus, unspecified ear: Secondary | ICD-10-CM

## 2014-11-09 DIAGNOSIS — R519 Headache, unspecified: Secondary | ICD-10-CM

## 2014-11-09 DIAGNOSIS — R51 Headache: Secondary | ICD-10-CM

## 2014-11-09 MED ORDER — GADOBENATE DIMEGLUMINE 529 MG/ML IV SOLN
20.0000 mL | Freq: Once | INTRAVENOUS | Status: AC | PRN
  Administered 2014-11-09: 20 mL via INTRAVENOUS

## 2014-11-14 ENCOUNTER — Telehealth (HOSPITAL_COMMUNITY): Payer: Self-pay | Admitting: Interventional Radiology

## 2014-11-14 NOTE — Telephone Encounter (Signed)
Called pt, asked her if she was having any new or worsening sx, she states she is not having any sx.  With that, I told her we would recheck an MRI, MRA, MRV in 1 year's time. She states understanding and is in agreement. JM

## 2015-10-25 ENCOUNTER — Other Ambulatory Visit (HOSPITAL_COMMUNITY): Payer: Self-pay | Admitting: Interventional Radiology

## 2015-10-25 DIAGNOSIS — I771 Stricture of artery: Secondary | ICD-10-CM

## 2015-12-25 ENCOUNTER — Ambulatory Visit (HOSPITAL_COMMUNITY): Payer: 59

## 2015-12-25 ENCOUNTER — Other Ambulatory Visit (HOSPITAL_COMMUNITY): Payer: Self-pay | Admitting: Interventional Radiology

## 2015-12-25 ENCOUNTER — Ambulatory Visit (HOSPITAL_COMMUNITY)
Admission: RE | Admit: 2015-12-25 | Discharge: 2015-12-25 | Disposition: A | Discharge status: Home / Self Care | Payer: 59 | Source: Ambulatory Visit | Attending: Interventional Radiology | Admitting: Interventional Radiology

## 2015-12-25 DIAGNOSIS — I771 Stricture of artery: Secondary | ICD-10-CM

## 2015-12-25 DIAGNOSIS — H9319 Tinnitus, unspecified ear: Secondary | ICD-10-CM | POA: Insufficient documentation

## 2015-12-25 MED ORDER — GADOBENATE DIMEGLUMINE 529 MG/ML IV SOLN
20.0000 mL | Freq: Once | INTRAVENOUS | Status: AC | PRN
  Administered 2015-12-25: 20 mL via INTRAVENOUS

## 2015-12-25 MED ORDER — ALPRAZOLAM 0.25 MG PO TABS
ORAL_TABLET | ORAL | Status: AC
  Filled 2015-12-25: qty 1

## 2015-12-25 MED ORDER — ALPRAZOLAM 0.25 MG PO TABS
0.2500 mg | ORAL_TABLET | Freq: Once | ORAL | Status: AC
Start: 2015-12-25 — End: 2015-12-25
  Administered 2015-12-25: 0.25 mg via ORAL
  Filled 2015-12-25: qty 1

## 2017-01-09 ENCOUNTER — Telehealth (HOSPITAL_COMMUNITY): Payer: Self-pay

## 2017-01-09 NOTE — Telephone Encounter (Signed)
Called to schedule f/u MRI, left message for pt to return call. AW 

## 2017-01-23 ENCOUNTER — Telehealth (HOSPITAL_COMMUNITY): Payer: Self-pay

## 2017-01-23 NOTE — Telephone Encounter (Signed)
Called to schedule f/u mri, left message for pt to return call. AW 

## 2017-01-26 ENCOUNTER — Other Ambulatory Visit (HOSPITAL_COMMUNITY): Payer: Self-pay | Admitting: Interventional Radiology

## 2017-01-26 DIAGNOSIS — I771 Stricture of artery: Secondary | ICD-10-CM

## 2017-01-27 ENCOUNTER — Telehealth: Payer: Self-pay | Admitting: Radiology

## 2017-01-27 NOTE — Progress Notes (Signed)
  Pt scheduled for MRI Brain 02/12/17 Claustrophobic and needs mild sedation  Called Valium 4 mg po (per Dr Estanislado Pandy) to [facility] [phone]  Pt is aware and knows to have driver back and forth Take medication 1 hr prior to procedure

## 2017-02-12 ENCOUNTER — Ambulatory Visit (HOSPITAL_COMMUNITY)
Admission: RE | Admit: 2017-02-12 | Discharge: 2017-02-12 | Disposition: A | Discharge status: Home / Self Care | Payer: 59 | Source: Ambulatory Visit | Attending: Interventional Radiology | Admitting: Interventional Radiology

## 2017-02-12 ENCOUNTER — Ambulatory Visit (HOSPITAL_COMMUNITY): Payer: 59

## 2017-02-12 DIAGNOSIS — I771 Stricture of artery: Secondary | ICD-10-CM | POA: Insufficient documentation

## 2017-02-12 MED ORDER — GADOBENATE DIMEGLUMINE 529 MG/ML IV SOLN
20.0000 mL | Freq: Once | INTRAVENOUS | Status: AC | PRN
  Administered 2017-02-12: 20 mL via INTRAVENOUS

## 2017-04-13 ENCOUNTER — Telehealth (HOSPITAL_COMMUNITY): Payer: Self-pay

## 2017-04-13 NOTE — Telephone Encounter (Signed)
Pt agreed to schedule mrv. AW

## 2017-04-14 ENCOUNTER — Other Ambulatory Visit (HOSPITAL_COMMUNITY): Payer: Self-pay | Admitting: Interventional Radiology

## 2017-04-14 DIAGNOSIS — I771 Stricture of artery: Secondary | ICD-10-CM

## 2017-04-24 ENCOUNTER — Ambulatory Visit (HOSPITAL_COMMUNITY)
Admission: RE | Admit: 2017-04-24 | Discharge: 2017-04-24 | Disposition: A | Discharge status: Home / Self Care | Payer: 59 | Source: Ambulatory Visit | Attending: Interventional Radiology | Admitting: Interventional Radiology

## 2017-04-24 DIAGNOSIS — I771 Stricture of artery: Secondary | ICD-10-CM | POA: Diagnosis present

## 2017-04-24 DIAGNOSIS — J328 Other chronic sinusitis: Secondary | ICD-10-CM | POA: Insufficient documentation

## 2017-04-24 MED ORDER — LORAZEPAM 2 MG/ML IJ SOLN
INTRAMUSCULAR | Status: AC
  Filled 2017-04-24: qty 1

## 2017-04-24 MED ORDER — LORAZEPAM 2 MG/ML IJ SOLN
1.0000 mg | Freq: Once | INTRAMUSCULAR | Status: DC
  Filled 2017-04-24: qty 0.5

## 2017-04-24 MED ORDER — LORAZEPAM 2 MG/ML IJ SOLN
1.0000 mg | INTRAMUSCULAR | Status: DC | PRN
  Filled 2017-04-24: qty 0.5

## 2017-05-14 ENCOUNTER — Telehealth (HOSPITAL_COMMUNITY): Payer: Self-pay

## 2017-05-14 NOTE — Telephone Encounter (Signed)
Pt agreed to f/u in 1 yr with mri/mra. AW 

## 2018-01-11 ENCOUNTER — Emergency Department (HOSPITAL_COMMUNITY): Payer: 59

## 2018-01-11 ENCOUNTER — Emergency Department (HOSPITAL_COMMUNITY)
Admission: EM | Admit: 2018-01-11 | Discharge: 2018-01-11 | Disposition: A | Discharge status: Home / Self Care | Payer: 59 | Source: Home / Self Care | Attending: Emergency Medicine | Admitting: Emergency Medicine

## 2018-01-11 ENCOUNTER — Emergency Department (HOSPITAL_COMMUNITY)
Admission: EM | Admit: 2018-01-11 | Discharge: 2018-01-12 | Disposition: A | Discharge status: Home / Self Care | Payer: 59 | Attending: Emergency Medicine | Admitting: Emergency Medicine

## 2018-01-11 ENCOUNTER — Encounter (HOSPITAL_COMMUNITY): Payer: Self-pay | Admitting: Emergency Medicine

## 2018-01-11 ENCOUNTER — Encounter (HOSPITAL_COMMUNITY): Payer: Self-pay | Admitting: Nurse Practitioner

## 2018-01-11 DIAGNOSIS — R109 Unspecified abdominal pain: Secondary | ICD-10-CM

## 2018-01-11 DIAGNOSIS — Z79899 Other long term (current) drug therapy: Secondary | ICD-10-CM | POA: Insufficient documentation

## 2018-01-11 DIAGNOSIS — R110 Nausea: Secondary | ICD-10-CM

## 2018-01-11 DIAGNOSIS — R1011 Right upper quadrant pain: Secondary | ICD-10-CM | POA: Diagnosis not present

## 2018-01-11 DIAGNOSIS — K5900 Constipation, unspecified: Secondary | ICD-10-CM | POA: Insufficient documentation

## 2018-01-11 LAB — URINALYSIS, ROUTINE W REFLEX MICROSCOPIC
BILIRUBIN URINE: NEGATIVE
Bilirubin Urine: NEGATIVE
GLUCOSE, UA: NEGATIVE mg/dL
Glucose, UA: NEGATIVE mg/dL
KETONES UR: 20 mg/dL — AB
Ketones, ur: 5 mg/dL — AB
LEUKOCYTES UA: NEGATIVE
Leukocytes, UA: NEGATIVE
NITRITE: NEGATIVE
NITRITE: NEGATIVE
PROTEIN: NEGATIVE mg/dL
PROTEIN: NEGATIVE mg/dL
SPECIFIC GRAVITY, URINE: 1.033 — AB (ref 1.005–1.030)
Specific Gravity, Urine: 1.015 (ref 1.005–1.030)
pH: 5 (ref 5.0–8.0)
pH: 5 (ref 5.0–8.0)

## 2018-01-11 LAB — COMPREHENSIVE METABOLIC PANEL
ALBUMIN: 3.8 g/dL (ref 3.5–5.0)
ALT: 29 U/L (ref 14–54)
ANION GAP: 11 (ref 5–15)
AST: 24 U/L (ref 15–41)
Alkaline Phosphatase: 51 U/L (ref 38–126)
BILIRUBIN TOTAL: 0.6 mg/dL (ref 0.3–1.2)
BUN: 7 mg/dL (ref 6–20)
CHLORIDE: 101 mmol/L (ref 101–111)
CO2: 23 mmol/L (ref 22–32)
Calcium: 8.6 mg/dL — ABNORMAL LOW (ref 8.9–10.3)
Creatinine, Ser: 0.71 mg/dL (ref 0.44–1.00)
GFR calc Af Amer: >60 mL/min (ref >60)
GFR calc non Af Amer: >60 mL/min (ref >60)
GLUCOSE: 112 mg/dL — AB (ref 65–99)
POTASSIUM: 3.7 mmol/L (ref 3.5–5.1)
Sodium: 135 mmol/L (ref 135–145)
TOTAL PROTEIN: 7.3 g/dL (ref 6.5–8.1)

## 2018-01-11 LAB — CBC
HCT: 41.3 % (ref 36.0–46.0)
HEMATOCRIT: 41.4 % (ref 36.0–46.0)
HEMOGLOBIN: 13.9 g/dL (ref 12.0–15.0)
HEMOGLOBIN: 13.4 g/dL (ref 12.0–15.0)
MCH: 29.6 pg (ref 26.0–34.0)
MCH: 29 pg (ref 26.0–34.0)
MCHC: 33.7 g/dL (ref 30.0–36.0)
MCHC: 32.4 g/dL (ref 30.0–36.0)
MCV: 88.1 fL (ref 78.0–100.0)
MCV: 89.6 fL (ref 78.0–100.0)
PLATELETS: 386 10*3/uL (ref 150–400)
Platelets: 379 10*3/uL (ref 150–400)
RBC: 4.69 MIL/uL (ref 3.87–5.11)
RBC: 4.62 MIL/uL (ref 3.87–5.11)
RDW: 13.4 % (ref 11.5–15.5)
RDW: 13.5 % (ref 11.5–15.5)
WBC: 10.3 10*3/uL (ref 4.0–10.5)
WBC: 10.1 10*3/uL (ref 4.0–10.5)

## 2018-01-11 LAB — I-STAT BETA HCG BLOOD, ED (MC, WL, AP ONLY)

## 2018-01-11 LAB — BASIC METABOLIC PANEL
Anion gap: 7 (ref 5–15)
BUN: 12 mg/dL (ref 6–20)
CO2: 26 mmol/L (ref 22–32)
CREATININE: 0.69 mg/dL (ref 0.44–1.00)
Calcium: 9.1 mg/dL (ref 8.9–10.3)
Chloride: 105 mmol/L (ref 101–111)
Glucose, Bld: 110 mg/dL — ABNORMAL HIGH (ref 65–99)
Potassium: 3.7 mmol/L (ref 3.5–5.1)
Sodium: 138 mmol/L (ref 135–145)

## 2018-01-11 LAB — LIPASE, BLOOD: LIPASE: 22 U/L (ref 11–51)

## 2018-01-11 MED ORDER — ONDANSETRON HCL 4 MG/2ML IJ SOLN
4.0000 mg | Freq: Once | INTRAMUSCULAR | Status: AC
  Administered 2018-01-11: 4 mg via INTRAVENOUS
  Filled 2018-01-11: qty 2

## 2018-01-11 MED ORDER — ONDANSETRON 8 MG PO TBDP: 8.0000 mg | ORAL_TABLET | Freq: Three times a day (TID) | ORAL | 0 refills | Status: DC | PRN

## 2018-01-11 MED ORDER — ONDANSETRON HCL 4 MG/2ML IJ SOLN
4.0000 mg | Freq: Once | INTRAMUSCULAR | Status: AC | PRN
  Administered 2018-01-11: 4 mg via INTRAVENOUS
  Filled 2018-01-11: qty 2

## 2018-01-11 MED ORDER — HYDROCODONE-ACETAMINOPHEN 5-325 MG PO TABS
1.0000 | ORAL_TABLET | Freq: Once | ORAL | Status: AC
Start: 2018-01-11 — End: 2018-01-11
  Administered 2018-01-11: 1 via ORAL
  Filled 2018-01-11: qty 1

## 2018-01-11 MED ORDER — HYDROCODONE-ACETAMINOPHEN 5-325 MG PO TABS: 1.0000 | ORAL_TABLET | Freq: Four times a day (QID) | ORAL | 0 refills | Status: DC | PRN

## 2018-01-11 MED ORDER — IOPAMIDOL (ISOVUE-300) INJECTION 61%
INTRAVENOUS | Status: AC
  Administered 2018-01-11: 100 mL via INTRAVENOUS
  Filled 2018-01-11: qty 100

## 2018-01-11 MED ORDER — FENTANYL CITRATE (PF) 100 MCG/2ML IJ SOLN
100.0000 ug | Freq: Once | INTRAMUSCULAR | Status: AC
  Administered 2018-01-11: 100 ug via INTRAVENOUS
  Filled 2018-01-11: qty 2

## 2018-01-11 MED ORDER — FENTANYL CITRATE (PF) 100 MCG/2ML IJ SOLN
50.0000 ug | Freq: Once | INTRAMUSCULAR | Status: AC
  Administered 2018-01-11: 50 ug via INTRAVENOUS
  Filled 2018-01-11: qty 2

## 2018-01-11 MED ORDER — SODIUM CHLORIDE 0.9 % IV BOLUS (SEPSIS)
1000.0000 mL | Freq: Once | INTRAVENOUS | Status: AC
  Administered 2018-01-11: 1000 mL via INTRAVENOUS

## 2018-01-11 NOTE — ED Triage Notes (Signed)
Pt is c/o right flank pain that has been ongoing for 2 days. Associated symptoms of constipation and nausea.

## 2018-01-11 NOTE — ED Triage Notes (Signed)
Patient reports right flank and right abdominal pain with nausea and diarrhea x1 today , seen at [facility] ER this morning discharged home with prescription Hydrocodone with no relief . She went to [facility] today x- ray done and advised to go to ER to rule out bowel obstruction .

## 2018-01-11 NOTE — ED Provider Notes (Signed)
[facility] DEPT Provider Note: Georgena Spurling, MD, FACEP  CSN: [id number] MRN: [id number] ARRIVAL: 01/11/18 at 0224 ROOM: WA10/WA10   CHIEF COMPLAINT  Flank Pain   HISTORY OF PRESENT ILLNESS  01/11/18 5:34 AM Stephanie [name] is a 47 y.o. female with a 1-1/2-day history of right flank pain.  She rates her pain as an 8 out of 10 with some radiation to the right upper quadrant of the abdomen.  It is not significantly affected by movement or palpation.  She states it is a dull pain and is been constant.  She has had associated nausea and constipation but no vomiting, dysuria, hematuria, fever or chills.  She has had difficulty finding a comfortable position.   Consultation with the [facility] state controlled substances database reveals the patient has received no opioid prescriptions in the past year.   Past Medical History:  Diagnosis Date  . Menorrhagia   . PONV (postoperative nausea and vomiting)    SEVERE  . Wears contact lenses     Past Surgical History:  Procedure Laterality Date  . ANTERIOR CERVICAL DECOMP/DISCECTOMY FUSION  06/ 2015   C4 -- C5  . CESAREAN SECTION  03-29-2002/  1997/  1994  . CHOLECYSTECTOMY  08/12/2012   Procedure: LAPAROSCOPIC CHOLECYSTECTOMY WITH INTRAOPERATIVE CHOLANGIOGRAM;  Surgeon: Earnstine Regal, MD;  Location: [facility];  Service: General;  Laterality: N/A;  . DILITATION & CURRETTAGE/HYSTROSCOPY WITH NOVASURE ABLATION N/A 10/26/2014   Procedure: DILATATION & CURETTAGE/HYSTEROSCOPY WITH ATTEMPTED NOVASURE ABLATION WITH IUD REMOVAL;  Surgeon: Maisie Fus, MD;  Location: [facility];  Service: Gynecology;  Laterality: N/A;  . INTRAUTERINE DEVICE (IUD) INSERTION  2010  . LAPAROSCOPIC TUBAL LIGATION Bilateral 10/26/2014   Procedure: LAPAROSCOPIC TUBAL LIGATION;  Surgeon: Maisie Fus, MD;  Location: [facility];  Service: Gynecology;  Laterality: Bilateral;  . LUMBAR LAMINECTOMY/DECOMPRESSION MICRODISCECTOMY  02/10/2012     Procedure: LUMBAR LAMINECTOMY/DECOMPRESSION MICRODISCECTOMY 1 LEVEL;  Surgeon: Charlie Pitter, MD;  Location: [facility];  Service: Neurosurgery;  Laterality: Left;  Left Lumbar Four-Five Laminectomy and Microdiskectomy  . REMOVAL BENIGN THROAT CYST  age 22    Family History  Problem Relation Age of Onset  . Anesthesia problems Mother     Social History   Tobacco Use  . Smoking status: Never Smoker  . Smokeless tobacco: Never Used  Substance Use Topics  . Alcohol use: No  . Drug use: No    Prior to Admission medications   Medication Sig Start Date End Date Taking? Authorizing Provider  estradiol (ESTRACE) 0.5 MG tablet Take 0.5 mg by mouth daily.   Yes [provider]  phentermine 37.5 MG capsule Take 37.5 mg by mouth daily after breakfast.   Yes [provider]  progesterone (PROMETRIUM) 100 MG capsule Take 100 mg by mouth daily.   Yes [provider]    Allergies Morphine and related   REVIEW OF SYSTEMS  Negative except as noted here or in the History of Present Illness.   PHYSICAL EXAMINATION  Initial Vital Signs Blood pressure 122/74, pulse 88, temperature 97.9 F (36.6 C), temperature source Oral, resp. rate 18, SpO2 98 %.  Examination General: Well-developed, well-nourished female in no acute distress; appearance consistent with age of record HENT: normocephalic; atraumatic Eyes: pupils equal, round and reactive to light; extraocular muscles intact Neck: supple Heart: regular rate and rhythm Lungs: clear to auscultation bilaterally Abdomen: soft; nondistended; nontender; no masses or hepatosplenomegaly; bowel sounds present GU: Minimal  right flank tenderness Extremities: No deformity; full range of motion; pulses normal Neurologic: Awake, alert and oriented; motor function intact in all extremities and symmetric; no facial droop Skin: Warm and dry Psychiatric: Normal mood and affect   RESULTS  Summary of this visit's results,  reviewed by myself:   EKG Interpretation  Date/Time:    Ventricular Rate:    PR Interval:    QRS Duration:   QT Interval:    QTC Calculation:   R Axis:     Text Interpretation:        Laboratory Studies: Results for orders placed or performed during the hospital encounter of 01/11/18 (from the past 24 hour(s))  CBC     Status: None   Collection Time: 01/11/18  3:00 AM  Result Value Ref Range   WBC 10.3 4.0 - 10.5 K/uL   RBC 4.69 3.87 - 5.11 MIL/uL   Hemoglobin 13.9 12.0 - 15.0 g/dL   HCT 41.3 36.0 - 46.0 %   MCV 88.1 78.0 - 100.0 fL   MCH 29.6 26.0 - 34.0 pg   MCHC 33.7 30.0 - 36.0 g/dL   RDW 13.4 11.5 - 15.5 %   Platelets 386 150 - 400 K/uL  Basic metabolic panel     Status: Abnormal   Collection Time: 01/11/18  3:00 AM  Result Value Ref Range   Sodium 138 135 - 145 mmol/L   Potassium 3.7 3.5 - 5.1 mmol/L   Chloride 105 101 - 111 mmol/L   CO2 26 22 - 32 mmol/L   Glucose, Bld 110 (H) 65 - 99 mg/dL   BUN 12 6 - 20 mg/dL   Creatinine, Ser 0.69 0.44 - 1.00 mg/dL   Calcium 9.1 8.9 - 10.3 mg/dL   GFR calc non Af Amer >60 >60 mL/min   GFR calc Af Amer >60 >60 mL/min   Anion gap 7 5 - 15  Urinalysis, Routine w reflex microscopic- may I&O cath if menses     Status: Abnormal   Collection Time: 01/11/18  3:07 AM  Result Value Ref Range   Color, Urine YELLOW YELLOW   APPearance CLEAR CLEAR   Specific Gravity, Urine 1.033 (H) 1.005 - 1.030   pH 5.0 5.0 - 8.0   Glucose, UA NEGATIVE NEGATIVE mg/dL   Hgb urine dipstick SMALL (A) NEGATIVE   Bilirubin Urine NEGATIVE NEGATIVE   Ketones, ur 5 (A) NEGATIVE mg/dL   Protein, ur NEGATIVE NEGATIVE mg/dL   Nitrite NEGATIVE NEGATIVE   Leukocytes, UA NEGATIVE NEGATIVE   RBC / HPF 0-5 0 - 5 RBC/hpf   WBC, UA 0-5 0 - 5 WBC/hpf   Bacteria, UA RARE (A) NONE SEEN   Squamous Epithelial / LPF 0-5 (A) NONE SEEN   Mucus PRESENT   I-Stat beta hCG blood, ED     Status: None   Collection Time: 01/11/18  3:10 AM  Result Value Ref Range    I-stat hCG, quantitative <5.0 <5 mIU/mL   Comment 3           Imaging Studies: Ct Renal Stone Study  Result Date: 01/11/2018 CLINICAL DATA:  Right flank pain for 2 days. Nausea and constipation. White cell count 10.3. Microhematuria. EXAM: CT ABDOMEN AND PELVIS WITHOUT CONTRAST TECHNIQUE: Multidetector CT imaging of the abdomen and pelvis was performed following the standard protocol without IV contrast. COMPARISON:  None. FINDINGS: Lower chest: Atelectasis in the lung bases. Hepatobiliary: No focal liver abnormality is seen. Status post cholecystectomy. No biliary dilatation. Pancreas: Unremarkable. No  pancreatic ductal dilatation or surrounding inflammatory changes. Spleen: Normal in size without focal abnormality. Adrenals/Urinary Tract: No adrenal gland nodules. Kidneys are symmetrical. No hydronephrosis or hydroureter. 2 mm stone in the lower pole right kidney. No ureteral stones or bladder stones. No bladder wall thickening. Stomach/Bowel: Stomach is within normal limits. Appendix appears normal. No evidence of bowel wall thickening, distention, or inflammatory changes. Vascular/Lymphatic: No significant vascular findings are present. No enlarged abdominal or pelvic lymph nodes. Reproductive: Nodular enlargement of the uterus likely representing fibroids. Ovaries are not enlarged. No free fluid in the pelvis. Other: No abdominal wall hernia or abnormality. No abdominopelvic ascites. Musculoskeletal: No acute or significant osseous findings. IMPRESSION: 1. No renal or ureteral stone or obstruction. 2. Probable uterine fibroids. 3. No bowel obstruction or inflammation. Electronically Signed   By: Lucienne Capers M.D.   On: 01/11/2018 06:27    ED COURSE  Nursing notes and initial vitals signs, including pulse oximetry, reviewed.  Vitals:   01/11/18 0415 01/11/18 0430 01/11/18 0500 01/11/18 0530  BP: 130/81 98/65 112/70 116/63  Pulse:  69 68 78  Resp:      Temp:      TempSrc:      SpO2:  94%  97% 97%   6:52 AM Pain improved with IV fentanyl.  I suspect her flank pain is musculoskeletal or radicular in nature.  PROCEDURES    ED DIAGNOSES     ICD-10-CM   1. Right flank pain R10.9        Dominica Kent, Jenny Reichmann, MD 01/11/18 [phone]

## 2018-01-11 NOTE — ED Notes (Signed)
Patient transported to CT 

## 2018-01-11 NOTE — ED Provider Notes (Signed)
[facility] EMERGENCY DEPARTMENT Provider Note   CSN: [id number] Arrival date & time: 01/11/18  2045     History   Chief Complaint Chief Complaint  Patient presents with  . Abdominal Pain    HPI Stephanie [name] is a 47 y.o. female.  The history is provided by the patient and medical records.     47 year old female with history of menorrhagia, lumbar disc herniation with prior surgery, presenting to the ED with right-sided abdominal pain.  Patient reports this is been ongoing for about 3 days now.  States she was seen at one urgent care on Sunday afternoon had a urine test that was normal.  She was seen in the emergency room earlier this morning around 2 AM and had labs as well as a CT renal study performed that were normal.  She was discharged home with hydrocodone but reports pain is continued worsening and she has had worsening nausea and some diarrhea (just liquid).  She went to [facility] urgent care this evening around 6 PM and had a x-ray performed which showed a bowel obstruction.  States she was told to come here for repeat CT scan.  She has had a few prior abdominal surgeries including cholecystectomy and C-section x3.  She denies any pain, vaginal discharge, or urinary symptoms.  She has not had any fever or chills.  States she has been taking the medications given earlier with relief.  Past Medical History:  Diagnosis Date  . Menorrhagia   . PONV (postoperative nausea and vomiting)    SEVERE  . Wears contact lenses     Patient Active Problem List   Diagnosis Date Noted  . Cholelithiasis 08/12/2012  . 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 DIAPHORESIS 06/01/2009  . PHARYNGITIS, STREPTOCOCCAL 05/08/2009  . ABNORMAL WEIGHT GAIN 05/08/2009    Past Surgical History:  Procedure Laterality Date  . ANTERIOR CERVICAL DECOMP/DISCECTOMY FUSION  06/ 2015   C4 -- C5  . BACK SURGERY    . CESAREAN SECTION  03-29-2002/  1997/  1994  .  CHOLECYSTECTOMY  08/12/2012   Procedure: LAPAROSCOPIC CHOLECYSTECTOMY WITH INTRAOPERATIVE CHOLANGIOGRAM;  Surgeon: Earnstine Regal, MD;  Location: [facility];  Service: General;  Laterality: N/A;  . DILITATION & CURRETTAGE/HYSTROSCOPY WITH NOVASURE ABLATION N/A 10/26/2014   Procedure: DILATATION & CURETTAGE/HYSTEROSCOPY WITH ATTEMPTED NOVASURE ABLATION WITH IUD REMOVAL;  Surgeon: Maisie Fus, MD;  Location: [place];  Service: Gynecology;  Laterality: N/A;  . INTRAUTERINE DEVICE (IUD) INSERTION  2010  . LAPAROSCOPIC TUBAL LIGATION Bilateral 10/26/2014   Procedure: LAPAROSCOPIC TUBAL LIGATION;  Surgeon: Maisie Fus, MD;  Location: [facility];  Service: Gynecology;  Laterality: Bilateral;  . LUMBAR LAMINECTOMY/DECOMPRESSION MICRODISCECTOMY  02/10/2012   Procedure: LUMBAR LAMINECTOMY/DECOMPRESSION MICRODISCECTOMY 1 LEVEL;  Surgeon: Charlie Pitter, MD;  Location: [place] [facility];  Service: Neurosurgery;  Laterality: Left;  Left Lumbar Four-Five Laminectomy and Microdiskectomy  . REMOVAL BENIGN THROAT CYST  age 72    OB History    No data available       Home Medications    Prior to Admission medications   Medication Sig Start Date End Date Taking? Authorizing Provider  estradiol (ESTRACE) 0.5 MG tablet Take 0.5 mg by mouth daily.    [provider]  HYDROcodone-acetaminophen (NORCO) 5-325 MG tablet Take 1 tablet by mouth every 6 (six) hours as needed (for pain). 01/11/18   Molpus, John, MD  ondansetron (ZOFRAN ODT) 8 MG disintegrating tablet  Take 1 tablet (8 mg total) by mouth every 8 (eight) hours as needed for nausea or vomiting. 01/11/18   Molpus, John, MD  phentermine 37.5 MG capsule Take 37.5 mg by mouth daily after breakfast.    [provider]  progesterone (PROMETRIUM) 100 MG capsule Take 100 mg by mouth daily.    [provider]    Family History Family History  Problem Relation Age of Onset  . Anesthesia problems Mother      Social History Social History   Tobacco Use  . Smoking status: Never Smoker  . Smokeless tobacco: Never Used  Substance Use Topics  . Alcohol use: No  . Drug use: No     Allergies   Morphine and related   Review of Systems Review of Systems  Gastrointestinal: Positive for abdominal pain, diarrhea and nausea.  All other systems reviewed and are negative.    Physical Exam Updated Vital Signs BP 140/88 (BP Location: Right Arm)   Pulse 94   Temp 98.3 F (36.8 C) (Oral)   Resp 16   Ht 5\' 4"  (1.626 m)   Wt 118.4 kg (261 lb)   SpO2 98%   BMI 44.80 kg/m   Physical Exam  Constitutional: She is oriented to person, place, and time. She appears well-developed and well-nourished.  HENT:  Head: Normocephalic and atraumatic.  Mouth/Throat: Oropharynx is clear and moist.  Eyes: Conjunctivae and EOM are normal. Pupils are equal, round, and reactive to light.  Neck: Normal range of motion.  Cardiovascular: Normal rate, regular rhythm and normal heart sounds.  Pulmonary/Chest: Effort normal and breath sounds normal.  Abdominal: Soft. Bowel sounds are normal. There is tenderness. There is no tenderness at McBurney's point.    Some tenderness of left mid/lateral abdomen; no peritoneal signs; normal bowel sounds; no apparent distention  Musculoskeletal: Normal range of motion.  Neurological: She is alert and oriented to person, place, and time.  Skin: Skin is warm and dry.  Psychiatric: She has a normal mood and affect.  Nursing note and vitals reviewed.    ED Treatments / Results  Labs (all labs ordered are listed, but only abnormal results are displayed) Labs Reviewed  COMPREHENSIVE METABOLIC PANEL - Abnormal; Notable for the following components:      Result Value   Glucose, Bld 112 (*)    Calcium 8.6 (*)    All other components within normal limits  URINALYSIS, ROUTINE W REFLEX MICROSCOPIC - Abnormal; Notable for the following components:   Hgb urine dipstick  MODERATE (*)    Ketones, ur 20 (*)    Bacteria, UA RARE (*)    Squamous Epithelial / LPF 0-5 (*)    All other components within normal limits  LIPASE, BLOOD  CBC  I-STAT BETA HCG BLOOD, ED ([facility], [facility], [facility] ONLY)    EKG  EKG Interpretation None       Radiology Ct Abdomen Pelvis W Contrast  Result Date: 01/12/2018 CLINICAL DATA:  Right flank and abdominal pain with diarrhea EXAM: CT ABDOMEN AND PELVIS WITH CONTRAST TECHNIQUE: Multidetector CT imaging of the abdomen and pelvis was performed using the standard protocol following bolus administration of intravenous contrast. CONTRAST:  125mL ISOVUE-300 IOPAMIDOL (ISOVUE-300) INJECTION 61% COMPARISON:  Radiograph 01/11/2018, CT 01/11/2018 FINDINGS: Lower chest: No acute abnormality. Hepatobiliary: No focal liver abnormality is seen. Status post cholecystectomy. No biliary dilatation. Pancreas: Unremarkable. No pancreatic ductal dilatation or surrounding inflammatory changes. Spleen: Normal in size without focal abnormality. Adrenals/Urinary Tract: Adrenal glands are unremarkable.  Kidneys are normal, without renal calculi, focal lesion, or hydronephrosis. Bladder is unremarkable. Stomach/Bowel: Stomach is within normal limits. Appendix appears normal. No evidence of bowel wall thickening, distention, or inflammatory changes. Diffuse fluid in the colon could be related to diarrheal illness. Vascular/Lymphatic: No significant vascular findings are present. No enlarged abdominal or pelvic lymph nodes. Reproductive: Lobulated uterine contour.  No adnexal mass. Other: No abdominal wall hernia or abnormality. No abdominopelvic ascites. Musculoskeletal: No acute or significant osseous findings. IMPRESSION: 1. No CT evidence for acute intra-abdominal or pelvic abnormality. Negative for bowel obstruction or bowel wall thickening. Diffuse fluid in the colon suggesting diarrheal illness 2. Lobulated uterine contour consistent with fibroids. Electronically Signed   By:  Donavan Foil M.D.   On: 01/12/2018 01:41    Procedures Procedures (including critical care time)  Medications Ordered in ED Medications - No data to display   Initial Impression / Assessment and Plan / ED Course  I have reviewed the triage vital signs and the nursing notes.  Pertinent labs & imaging results that were available during my care of the patient were reviewed by me and considered in my medical decision making (see chart for details).   10:43 PM Patient seen and evaluated. Essentially sent here from urgent care due to findings of obstruction/ileus on x-ray.  CT renal study earlier in the morning was negative. I have reviewed her paperwork from urgent care, however x-rays not visible for review.  Her labs currently are reassuring.  On exam she does have some tenderness in the right mid abdomen.  I discussed with her the fact that obtaining another CT is quite a bit of radiation for her as this would be her 3rd imaging study in 24 hours.  She acknowledged this but still wants confirmatory imaging as she feels something is wrong.  Will go ahead and get CT with contrast.  IV medications ordered.  1:30 AM Delay in CT read--patient returned from scan over an hour ago, however no images visible in epic at this time.  I have spoken with CT, her exam was not ended or transmitted to radiologist.  They will address this now.  Apologized to patient for delay.  CT without acute findings aside from liquid stool in the colon suggesting diarrheal illness.  She remains nontoxic in appearance.  Suspect this is viral.  Results discussed with patient and her family.  She will be discharged home with symptomatic care.  Close follow-up with PCP.  Discussed plan with patient, she acknowledged understanding and agreed with plan of care.  Return precautions given for new or worsening symptoms.  Final Clinical Impressions(s) / ED Diagnoses   Final diagnoses:  Abdominal pain, unspecified abdominal  location    ED Discharge Orders        Ordered    dicyclomine (BENTYL) 20 MG tablet  2 times daily     01/12/18 0247       Larene Pickett, PA-C 01/12/18 0529    Drenda Freeze, MD 01/12/18 [phone]

## 2018-01-12 LAB — URINE CULTURE

## 2018-01-12 MED ORDER — FENTANYL CITRATE (PF) 100 MCG/2ML IJ SOLN
50.0000 ug | Freq: Once | INTRAMUSCULAR | Status: AC
  Administered 2018-01-12: 50 ug via INTRAVENOUS
  Filled 2018-01-12: qty 2

## 2018-01-12 MED ORDER — ONDANSETRON HCL 4 MG/2ML IJ SOLN
4.0000 mg | Freq: Once | INTRAMUSCULAR | Status: AC
  Administered 2018-01-12: 4 mg via INTRAVENOUS
  Filled 2018-01-12: qty 2

## 2018-01-12 MED ORDER — SODIUM CHLORIDE 0.9 % IV BOLUS (SEPSIS)
1000.0000 mL | Freq: Once | INTRAVENOUS | Status: AC
  Administered 2018-01-12: 1000 mL via INTRAVENOUS

## 2018-01-12 MED ORDER — HYDROMORPHONE HCL 1 MG/ML IJ SOLN
1.0000 mg | Freq: Once | INTRAMUSCULAR | Status: AC
  Administered 2018-01-12: 1 mg via INTRAVENOUS
  Filled 2018-01-12: qty 1

## 2018-01-12 MED ORDER — DICYCLOMINE HCL 20 MG PO TABS: 20.0000 mg | ORAL_TABLET | Freq: Two times a day (BID) | ORAL | 0 refills | Status: DC

## 2018-01-12 NOTE — Discharge Instructions (Signed)
Your CT today did not show any signs of bowel obstruction-- suggested diarrheal illness which is likely viral. Continue medications from earlier visits, add bentyl to this. Follow-up with your primary care doctor. Return here for new concerns.

## 2018-01-14 ENCOUNTER — Encounter: Payer: Self-pay | Admitting: Gastroenterology

## 2018-01-14 ENCOUNTER — Ambulatory Visit (INDEPENDENT_AMBULATORY_CARE_PROVIDER_SITE_OTHER): Payer: 59 | Admitting: Gastroenterology

## 2018-01-14 VITALS — BP 122/84 | HR 80 | Ht 64.0 in | Wt 263.0 lb

## 2018-01-14 DIAGNOSIS — B029 Zoster without complications: Secondary | ICD-10-CM | POA: Insufficient documentation

## 2018-01-14 MED ORDER — HYDROCODONE-ACETAMINOPHEN 5-325 MG PO TABS: 1.0000 | ORAL_TABLET | Freq: Four times a day (QID) | ORAL | 0 refills | Status: DC | PRN

## 2018-01-14 MED ORDER — VALACYCLOVIR HCL 1 G PO TABS: 1000.0000 mg | ORAL_TABLET | Freq: Three times a day (TID) | ORAL | 0 refills | Status: DC

## 2018-01-14 NOTE — Progress Notes (Addendum)
01/14/2018 Stephanie [name] [id number] July 02, 1971   HISTORY OF PRESENT ILLNESS: This is a pleasant 47 year old female who is new to our office.  She has been referred here by her gynecologist, Dr. Nori Riis, for evaluation regarding right side/back/flank pain.  She tells me that she had been feeling fine and then suddenly Saturday evening she started having pain in this area which was very uncomfortable and progressively worsened.  She says that it kept her up all night she could not get comfortable.  In the past 5 days she has been to urgent care twice, emergency department twice, has had 2 CT scans--one without contrast and one with contrast, has had lab studies performed, seen her gynecologist, and had a pelvic ultrasound, all of which been unremarkable.  Therefore, she was sent to GI for further evaluation.  She reports some constipation with her last bowel movement 3 days ago, but received a lot of pain medication in the ER and has been taking hydrocodone at home.  Prior to the onset of this pain she had been moving her bowels just fine.  She has had some nausea with this, but no vomiting.  Says that she has never had pain like this in the past.  She has been trying to put the heating pad on the area and stand in a hot shower, which does seem to help.  She says that the pain medication helps to some degree, but as soon as it wears off the pain is back.  It is constant.  She says that it sometimes hurts to even have clothes touching that area and has a burning/numbing/tingling sensation.   Past Medical History:  Diagnosis Date  . Menorrhagia   . PONV (postoperative nausea and vomiting)    SEVERE  . Wears contact lenses    Past Surgical History:  Procedure Laterality Date  . ANTERIOR CERVICAL DECOMP/DISCECTOMY FUSION  06/ 2015   C4 -- C5  . BACK SURGERY    . CESAREAN SECTION  03-29-2002/  1997/  1994  . CHOLECYSTECTOMY  08/12/2012   Procedure: LAPAROSCOPIC CHOLECYSTECTOMY WITH INTRAOPERATIVE  CHOLANGIOGRAM;  Surgeon: Earnstine Regal, MD;  Location: [facility];  Service: General;  Laterality: N/A;  . DILITATION & CURRETTAGE/HYSTROSCOPY WITH NOVASURE ABLATION N/A 10/26/2014   Procedure: DILATATION & CURETTAGE/HYSTEROSCOPY WITH ATTEMPTED NOVASURE ABLATION WITH IUD REMOVAL;  Surgeon: Maisie Fus, MD;  Location: [place];  Service: Gynecology;  Laterality: N/A;  . INTRAUTERINE DEVICE (IUD) INSERTION  2010  . LAPAROSCOPIC TUBAL LIGATION Bilateral 10/26/2014   Procedure: LAPAROSCOPIC TUBAL LIGATION;  Surgeon: Maisie Fus, MD;  Location: [facility];  Service: Gynecology;  Laterality: Bilateral;  . LUMBAR LAMINECTOMY/DECOMPRESSION MICRODISCECTOMY  02/10/2012   Procedure: LUMBAR LAMINECTOMY/DECOMPRESSION MICRODISCECTOMY 1 LEVEL;  Surgeon: Charlie Pitter, MD;  Location: [place] [facility];  Service: Neurosurgery;  Laterality: Left;  Left Lumbar Four-Five Laminectomy and Microdiskectomy  . REMOVAL BENIGN THROAT CYST  age 53    reports that  has never smoked. she has never used smokeless tobacco. She reports that she does not drink alcohol or use drugs. family history includes Anesthesia problems in her mother. Allergies  Allergen Reactions  . Morphine And Related Itching and Nausea And Vomiting      Outpatient Encounter Medications as of 01/14/2018  Medication Sig  . dicyclomine (BENTYL) 20 MG tablet Take 1 tablet (20 mg total) by mouth 2 (two) times daily.  Marland Kitchen estradiol (ESTRACE) 0.5 MG tablet Take 0.5 mg by  mouth daily.  Marland Kitchen HYDROcodone-acetaminophen (NORCO) 5-325 MG tablet Take 1 tablet by mouth every 6 (six) hours as needed (for pain).  . ondansetron (ZOFRAN ODT) 8 MG disintegrating tablet Take 1 tablet (8 mg total) by mouth every 8 (eight) hours as needed for nausea or vomiting.  . phentermine 37.5 MG capsule Take 37.5 mg by mouth daily after breakfast.  . progesterone (PROMETRIUM) 100 MG capsule Take 100 mg by mouth daily.   No facility-administered encounter  medications on file as of 01/14/2018.      REVIEW OF SYSTEMS  : All other systems reviewed and negative except where noted in the History of Present Illness.   PHYSICAL EXAM: BP 122/84   Pulse 80   Ht 5\' 4"  (1.626 m)   Wt 263 lb (119.3 kg)   BMI 45.14 kg/m  General: Well developed white female in no acute distress Head: Normocephalic and atraumatic Eyes:  Sclerae anicteric, conjunctiva pink. Ears: Normal auditory acuity Lungs: Clear throughout to auscultation; no increased WOB. Heart: Regular rate and rhythm; no M/R/G. Abdomen: Soft, non-distended.  BS present.  Non-tender. Musculoskeletal: Symmetrical with no gross deformities.  Some superficial tenderness on the right flank.  One crusted lesion noted on the right side of her back in the same dermatome as her pain.  Skin: No lesions on visible extremities Extremities: No edema  Neurological: Alert oriented x 4, grossly non-focal Psychological:  Alert and cooperative. Normal mood and affect  ASSESSMENT AND PLAN: *47 year old female with right back and flank pain that began suddenly 5 days ago.  Extensive evaluation including 2 CT scans, labs, and pelvic ultrasound this week have been unrevealing.  Clinically sounds like shingles and she does have one crusted lesion in the same dermatome as her pain.  I highly suspect that she has shingles.  Will treat with Valacyclovir 1000 mg TID for 7 days.  Will refill her hydrocodone.  She will call back on Monday with an update.   CC:  Maisie Fus, MD   Addendum: Reviewed and agree with initial management. Pyrtle, Lajuan Lines, MD

## 2018-01-14 NOTE — Patient Instructions (Signed)
If you are age 49 or older, your body mass index should be between 23-30. Your Body mass index is 45.14 kg/m. If this is out of the aforementioned range listed, please consider follow up with your Primary Care Provider.  If you are age 37 or younger, your body mass index should be between 19-25. Your Body mass index is 45.14 kg/m. If this is out of the aformentioned range listed, please consider follow up with your Primary Care Provider.   We have sent the following medications to your pharmacy for you to pick up at your convenience: Valacyclovir Hydrocodone  Call back on Monday with an update.  Ask for Patty.  Thank you for choosing me and [facility].   Alonza Bogus, PA-C

## 2018-01-15 ENCOUNTER — Telehealth: Payer: Self-pay | Admitting: Gastroenterology

## 2018-01-15 NOTE — Telephone Encounter (Signed)
The pt has been given the recommendations and will call back with any further concerns

## 2018-01-15 NOTE — Telephone Encounter (Signed)
I prefer not to prescribe something but she can try something over the counter with benadryl in it first.  Z-quil specifically may work well for her.  Thank you,  Jess

## 2018-01-15 NOTE — Telephone Encounter (Signed)
Patient wanting to know if APP Janett Billow can prescribe her anything else to sleep at night. Pt states she is going on night five of not sleeping due to her shingles.

## 2018-01-15 NOTE — Telephone Encounter (Signed)
Stephanie [name] the pt is not sleeping and would like to know if you can prescribe her something.  Please advise.

## 2018-01-18 ENCOUNTER — Telehealth: Payer: Self-pay

## 2018-01-18 NOTE — Telephone Encounter (Signed)
The patient has been notified of this information and all questions answered.

## 2018-01-18 NOTE — Telephone Encounter (Signed)
-----   Message from Loralie Champagne, PA-C sent at 01/18/2018  1:35 PM EST ----- Will you please contact the patient and see how she is doing with the valacyclovir?  She is the patient that I am treating for shingles.  Thank you,  Jess

## 2018-01-18 NOTE — Telephone Encounter (Signed)
Great.  Please have her give [place] a call back at the end of the week with another update.  Thank you,  Jess

## 2018-01-18 NOTE — Telephone Encounter (Signed)
The pain is better, the rash is better but a small patch has shown up on her right side.  She has 3 days left of valtrex.  She is able to sleep better.

## 2018-05-13 ENCOUNTER — Telehealth: Payer: Self-pay | Admitting: Physician Assistant

## 2018-05-13 ENCOUNTER — Other Ambulatory Visit (HOSPITAL_COMMUNITY): Payer: Self-pay | Admitting: Interventional Radiology

## 2018-05-13 DIAGNOSIS — I771 Stricture of artery: Secondary | ICD-10-CM

## 2018-05-13 NOTE — Progress Notes (Signed)
  Patient has MRI scheduled on May 26, 2018.  She has severe claustrophobia and requests pre-medication.  I called and spoke with Ms. Snyders. She has had an MRI previously and had valium at that time.  She states the Valium did not work "at all" for her and she was very anxious.  She is requesting "something different".  Chart and allergies reviewed. Cervical spine surgery and 'back surgery' are listed in her problems.   Hydrocodone listed in her  Medications.  I have called in Ativan 2 mg to [facility] on [address].  She is to take one by mouth 1.5 hours prior to procedure. She is to take the other with her and take it only if needed.  She understands she is to have a driver and she should have someone stay with her until she is fully awake.  She understands NOT to drive for 24 hours after the dose of Ativan.  Lanita Stammen S Mallery Harshman PA-C 05/13/2018 10:41 AM

## 2018-05-26 ENCOUNTER — Ambulatory Visit (HOSPITAL_COMMUNITY)
Admission: RE | Admit: 2018-05-26 | Discharge: 2018-05-26 | Disposition: A | Discharge status: Home / Self Care | Payer: 59 | Source: Ambulatory Visit | Attending: Interventional Radiology | Admitting: Interventional Radiology

## 2018-05-26 DIAGNOSIS — G9389 Other specified disorders of brain: Secondary | ICD-10-CM | POA: Diagnosis not present

## 2018-05-26 DIAGNOSIS — I771 Stricture of artery: Secondary | ICD-10-CM | POA: Diagnosis not present

## 2018-05-26 MED ORDER — GADOBENATE DIMEGLUMINE 529 MG/ML IV SOLN
20.0000 mL | Freq: Once | INTRAVENOUS | Status: AC | PRN
  Administered 2018-05-26: 20 mL via INTRAVENOUS

## 2018-06-09 ENCOUNTER — Other Ambulatory Visit (HOSPITAL_COMMUNITY): Payer: Self-pay | Admitting: Interventional Radiology

## 2018-06-09 ENCOUNTER — Telehealth (HOSPITAL_COMMUNITY): Payer: Self-pay

## 2018-06-09 DIAGNOSIS — H9319 Tinnitus, unspecified ear: Secondary | ICD-10-CM

## 2018-06-09 DIAGNOSIS — I771 Stricture of artery: Secondary | ICD-10-CM

## 2018-06-09 NOTE — Telephone Encounter (Signed)
Called to schedule consult to discuss recent mri, no answer. Left vm. AW

## 2018-06-22 ENCOUNTER — Ambulatory Visit (HOSPITAL_COMMUNITY)
Admission: RE | Admit: 2018-06-22 | Discharge: 2018-06-22 | Disposition: A | Discharge status: Home / Self Care | Payer: 59 | Source: Ambulatory Visit | Attending: Interventional Radiology | Admitting: Interventional Radiology

## 2018-06-22 DIAGNOSIS — H9319 Tinnitus, unspecified ear: Secondary | ICD-10-CM

## 2018-06-22 DIAGNOSIS — I771 Stricture of artery: Secondary | ICD-10-CM

## 2018-06-22 HISTORY — PX: IR RADIOLOGIST EVAL & MGMT: IMG5224

## 2018-06-23 ENCOUNTER — Encounter (HOSPITAL_COMMUNITY): Payer: Self-pay | Admitting: Interventional Radiology

## 2018-12-22 DIAGNOSIS — J0100 Acute maxillary sinusitis, unspecified: Secondary | ICD-10-CM | POA: Diagnosis not present

## 2019-04-12 DIAGNOSIS — K228 Other specified diseases of esophagus: Secondary | ICD-10-CM | POA: Diagnosis not present

## 2019-04-12 DIAGNOSIS — R1310 Dysphagia, unspecified: Secondary | ICD-10-CM | POA: Diagnosis not present

## 2019-04-12 DIAGNOSIS — K219 Gastro-esophageal reflux disease without esophagitis: Secondary | ICD-10-CM | POA: Diagnosis not present

## 2019-06-02 DIAGNOSIS — F419 Anxiety disorder, unspecified: Secondary | ICD-10-CM | POA: Diagnosis not present

## 2019-06-30 DIAGNOSIS — F419 Anxiety disorder, unspecified: Secondary | ICD-10-CM | POA: Diagnosis not present

## 2019-08-22 DIAGNOSIS — F419 Anxiety disorder, unspecified: Secondary | ICD-10-CM | POA: Diagnosis not present

## 2019-09-03 DIAGNOSIS — T63621A Toxic effect of contact with other jellyfish, accidental (unintentional), initial encounter: Secondary | ICD-10-CM | POA: Diagnosis not present

## 2019-10-03 DIAGNOSIS — F419 Anxiety disorder, unspecified: Secondary | ICD-10-CM | POA: Diagnosis not present

## 2019-10-13 ENCOUNTER — Telehealth (HOSPITAL_COMMUNITY): Payer: Self-pay

## 2019-10-13 NOTE — Telephone Encounter (Signed)
Pt called with updated insurance info. Sent new request to [place] for [place]. Will call to schedule once I obtain auth. Pt agreed. AW

## 2019-10-20 ENCOUNTER — Other Ambulatory Visit (HOSPITAL_COMMUNITY): Payer: Self-pay | Admitting: Interventional Radiology

## 2019-10-20 DIAGNOSIS — I771 Stricture of artery: Secondary | ICD-10-CM

## 2019-10-24 ENCOUNTER — Telehealth: Payer: Self-pay | Admitting: Student

## 2019-10-24 ENCOUNTER — Other Ambulatory Visit: Payer: Self-pay | Admitting: Student

## 2019-10-24 DIAGNOSIS — Z1159 Encounter for screening for other viral diseases: Secondary | ICD-10-CM | POA: Diagnosis not present

## 2019-10-24 NOTE — Telephone Encounter (Signed)
NIR.  Received message from patient requesting anti-anxiety medications for MRI scan due to claustrophobic. [facility] in [place], [place] [phone]) at 1219 to fill prescription- Valium 5 mg tablets; take one tablet by mouth 1 hour prior to MRI 11/07/2019, take one additional tablet 1 hour later if desired effects not achieved; dispense 2 tablets with 0 refills.   Bea Graff Seaver Machia, PA-C 10/24/2019, 12:32 PM

## 2019-10-27 DIAGNOSIS — K644 Residual hemorrhoidal skin tags: Secondary | ICD-10-CM | POA: Diagnosis not present

## 2019-10-27 DIAGNOSIS — K635 Polyp of colon: Secondary | ICD-10-CM | POA: Diagnosis not present

## 2019-10-27 DIAGNOSIS — Z1211 Encounter for screening for malignant neoplasm of colon: Secondary | ICD-10-CM | POA: Diagnosis not present

## 2019-10-31 DIAGNOSIS — Z23 Encounter for immunization: Secondary | ICD-10-CM | POA: Diagnosis not present

## 2019-11-07 ENCOUNTER — Ambulatory Visit (HOSPITAL_COMMUNITY): Admission: RE | Admit: 2019-11-07 | Payer: BC Managed Care – PPO | Source: Ambulatory Visit

## 2019-11-07 ENCOUNTER — Ambulatory Visit (HOSPITAL_COMMUNITY)
Admission: RE | Admit: 2019-11-07 | Discharge: 2019-11-07 | Disposition: A | Discharge status: Home / Self Care | Payer: BC Managed Care – PPO | Source: Ambulatory Visit | Attending: Interventional Radiology | Admitting: Interventional Radiology

## 2019-11-07 ENCOUNTER — Other Ambulatory Visit: Payer: Self-pay

## 2019-11-07 DIAGNOSIS — I771 Stricture of artery: Secondary | ICD-10-CM | POA: Diagnosis not present

## 2019-11-07 DIAGNOSIS — I676 Nonpyogenic thrombosis of intracranial venous system: Secondary | ICD-10-CM | POA: Diagnosis not present

## 2019-11-07 MED ORDER — GADOBUTROL 1 MMOL/ML IV SOLN
10.0000 mL | Freq: Once | INTRAVENOUS | Status: AC | PRN
  Administered 2019-11-07: 10 mL via INTRAVENOUS

## 2019-11-15 ENCOUNTER — Other Ambulatory Visit (HOSPITAL_COMMUNITY): Payer: Self-pay | Admitting: Interventional Radiology

## 2019-11-15 DIAGNOSIS — I771 Stricture of artery: Secondary | ICD-10-CM

## 2019-11-18 ENCOUNTER — Other Ambulatory Visit: Payer: Self-pay

## 2019-11-18 ENCOUNTER — Ambulatory Visit (HOSPITAL_COMMUNITY)
Admission: RE | Admit: 2019-11-18 | Discharge: 2019-11-18 | Disposition: A | Discharge status: Home / Self Care | Payer: BC Managed Care – PPO | Source: Ambulatory Visit | Attending: Interventional Radiology | Admitting: Interventional Radiology

## 2019-11-18 DIAGNOSIS — I676 Nonpyogenic thrombosis of intracranial venous system: Secondary | ICD-10-CM | POA: Diagnosis not present

## 2019-11-18 DIAGNOSIS — I771 Stricture of artery: Secondary | ICD-10-CM

## 2019-11-18 NOTE — Progress Notes (Signed)
Chief Complaint: [name] was seen today to discuss recent MRI/MRA results  Supervising Physician: Luanne Bras  [name] Status: [facility] - Out-pt  History of Present Illness: Stephanie [name] is a 48 y.o. female with a past medical history as below, with pertinent past medical history including chronic right sided pulsatile tinnitus followed by Dr. Estanislado Pandy since 2013 who presents today to discuss Stephanie results of Stephanie [name] recent MRI/MRA dated 11/08/19. Briefly, Stephanie [name] was initially seen by [facility] on 01/26/2012 for a diagnostic cerebral angiogram to further evaluate Stephanie [name] right sided tinnitus. 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 Stephanie [name] again underwent a diagnostic cerebral angiogram on 06/18/2012 to re-evaluate Stephanie previous findings. 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 [name] opted to continue with surveillance and has undergone yearly MRI/MRA with no reported change in symptoms. Stephanie [name] most recent MRI/MRA was performed on 11/08/19 which noted previously known stenosis and no other acute abnormalities.   Stephanie [name] reports that Stephanie [name] continues to have right tinnitus which Stephanie [name] describes as a "whooshing" sound and is worse when Stephanie [name] lays on Stephanie [name] left side, Stephanie [name] does not believe it has worsened since Stephanie [name] last saw our service. Stephanie [name] had described intermittent morning headaches during Stephanie [name] visit with [place] last year and states that these have increased in frequent lately - now occurring 1-2x per week. Stephanie headaches improve with Tylenol and ambulation. Stephanie headaches occasionally wake Stephanie [name] up from sleep. Stephanie [name] denies any other neurologic symptoms associated with theses headaches or at any other time. Stephanie [name] continues to take ASA 81 mg QD and has recently started on a new anti-anxiety medication. Stephanie [name] denies tobacco, illicit drug or ETOH use. Stephanie [name] endorses drinking 2 caffeinated diet  pepsis and 1 de-caffeinated diet pepsi daily. Stephanie [name] does not have any issue with blood pressure that Stephanie [name] is aware of, no history of diabetes. Stephanie [name] believes Stephanie [name] weight has remained Stephanie same since our last visit.   Past Medical History:  Diagnosis Date   Menorrhagia    PONV (postoperative nausea and vomiting)    SEVERE   Wears contact lenses     Past Surgical History:  Procedure Laterality Date   ANTERIOR CERVICAL DECOMP/DISCECTOMY FUSION  06/ 2015   C4 -- C5   BACK SURGERY     CESAREAN SECTION  03-29-2002/  1997/  1994   CHOLECYSTECTOMY  08/12/2012   Procedure: LAPAROSCOPIC CHOLECYSTECTOMY WITH INTRAOPERATIVE CHOLANGIOGRAM;  Surgeon: Earnstine Regal, MD;  Location: [facility];  Service: General;  Laterality: N/A;   DILITATION & CURRETTAGE/HYSTROSCOPY WITH NOVASURE ABLATION N/A 10/26/2014   Procedure: DILATATION & CURETTAGE/HYSTEROSCOPY WITH ATTEMPTED NOVASURE ABLATION WITH IUD REMOVAL;  Surgeon: Maisie Fus, MD;  Location: [place];  Service: Gynecology;  Laterality: N/A;   INTRAUTERINE DEVICE (IUD) INSERTION  2010   IR RADIOLOGIST EVAL & MGMT  06/22/2018   LAPAROSCOPIC TUBAL LIGATION Bilateral 10/26/2014   Procedure: LAPAROSCOPIC TUBAL LIGATION;  Surgeon: Maisie Fus, MD;  Location: [facility];  Service: Gynecology;  Laterality: Bilateral;   LUMBAR LAMINECTOMY/DECOMPRESSION MICRODISCECTOMY  02/10/2012   Procedure: LUMBAR LAMINECTOMY/DECOMPRESSION MICRODISCECTOMY 1 LEVEL;  Surgeon: Charlie Pitter, MD;  Location: [facility];  Service: Neurosurgery;  Laterality: Left;  Left Lumbar Four-Five Laminectomy and Microdiskectomy   REMOVAL BENIGN THROAT CYST  age 48    Allergies: Morphine and related  Medications: Prior to Admission medications   Medication Sig Start  Date End Date Taking? Authorizing Provider  dicyclomine (BENTYL) 20 MG tablet Take 1 tablet (20 mg total) by mouth 2 (two) times daily. 01/12/18   Larene Pickett, PA-C  estradiol (ESTRACE) 0.5  MG tablet Take 0.5 mg by mouth daily.    [provider]  HYDROcodone-acetaminophen (NORCO) 5-325 MG tablet Take 1 tablet by mouth every 6 (six) hours as needed (for pain). 01/14/18   Zehr, Laban Emperor, PA-C  ondansetron (ZOFRAN ODT) 8 MG disintegrating tablet Take 1 tablet (8 mg total) by mouth every 8 (eight) hours as needed for nausea or vomiting. 01/11/18   Molpus, John, MD  phentermine 37.5 MG capsule Take 37.5 mg by mouth daily after breakfast.    [provider]  progesterone (PROMETRIUM) 100 MG capsule Take 100 mg by mouth daily.    [provider]  valACYclovir (VALTREX) 1000 MG tablet Take 1 tablet (1,000 mg total) by mouth 3 (three) times daily. 01/14/18   Zehr, Laban Emperor, PA-C     Family History  Problem Relation Age of Onset   Anesthesia problems Mother     Social History   Socioeconomic History   Marital status: Married    Spouse name: Not on file   Number of children: Not on file   Years of education: Not on file   Highest education level: Not on file  Occupational History   Occupation: Programme researcher, broadcasting/film/video: [employer]  Social Needs   Financial resource strain: Not on file   Food insecurity    Worry: Not on file    Inability: Not on file   Transportation needs    Medical: Not on file    Non-medical: Not on file  Tobacco Use   Smoking status: Never Smoker   Smokeless tobacco: Never Used  Substance and Sexual Activity   Alcohol use: No   Drug use: No   Sexual activity: Yes    Birth control/protection: I.U.D.  Lifestyle   Physical activity    Days per week: Not on file    Minutes per session: Not on file   Stress: Not on file  Relationships   Social connections    Talks on phone: Not on file    Gets together: Not on file    Attends religious service: Not on file    Active member of club or organization: Not on file    Attends meetings of clubs or organizations: Not on file    Relationship status: Not on file    Other Topics Concern   Not on file  Social History Narrative   Not on file     Review of Systems: A 12 point ROS discussed and pertinent positives are indicated in Stephanie HPI above.  All other systems are negative.  Review of Systems  Constitutional: Negative for appetite change, chills, fatigue and fever.  HENT: Positive for tinnitus. Negative for hearing loss, nosebleeds and trouble swallowing.   Eyes: Negative for photophobia, pain, redness and visual disturbance.  Respiratory: Negative for cough and shortness of breath.   Cardiovascular: Negative for chest pain.  Gastrointestinal: Negative for abdominal pain, nausea and vomiting.  Neurological: Positive for headaches. Negative for dizziness, tremors, seizures, syncope, facial asymmetry, speech difficulty, weakness, light-headedness and numbness.  Psychiatric/Behavioral: Positive for sleep disturbance (HA wake Stephanie [name] up from sleep at times). Negative for confusion. Stephanie [name] is nervous/anxious.     Vital Signs: There were no vitals taken for this visit.  Physical Exam Constitutional:  General: Stephanie [name] is not in acute distress.    Comments: Husband present during visit  Pulmonary:     Effort: Pulmonary effort is normal.  Skin:    General: Skin is warm and dry.  Neurological:     General: No focal deficit present.     Mental Status: Stephanie [name] is alert. Mental status is at baseline.     Cranial Nerves: No cranial nerve deficit.  Psychiatric:        Mood and Affect: Mood normal.        Behavior: Behavior normal.        Thought Content: Thought content normal.        Judgment: Judgment normal.      Imaging: Mr Angio Head Wo Contrast  Result Date: 11/08/2019 CLINICAL DATA:  Right pulsatile tinnitus, right transverse sinus stenosis, follow-up size of small sub EXAM: MRI HEAD WITHOUT AND WITH CONTRAST MRA HEAD WITHOUT CONTRAST TECHNIQUE: Multiplanar, multiecho pulse sequences of Stephanie brain and surrounding structures were obtained  without and with intravenous contrast. Angiographic images of Stephanie head were obtained using MRA technique without contrast. CONTRAST:  31mL GADAVIST GADOBUTROL 1 MMOL/ML IV SOLN COMPARISON:  May 26, 2018 FINDINGS: MRI HEAD FINDINGS Brain: There is no acute infarction or intracranial hemorrhage. There is no intracranial mass, mass effect, edema, hydrocephalus, or extra-axial fluid collection. No abnormal enhancement Vascular: Normal flow voids. As noted previously, there is a dominant right transverse sinus with stenosis at Stephanie junction of Stephanie transverse and sigmoid sinuses. Skull and upper cervical spine: Normal marrow signal. Sinuses/Orbits: Mild retained secretions within Stephanie left sphenoid sinus. Otherwise trace mucosal thickening. Orbits are unremarkable. Other: None. MRA HEAD FINDINGS Intracranial internal carotid arteries are patent. Middle and anterior cerebral arteries are patent. Intracranial vertebral arteries, basilar artery, posterior cerebral arteries are patent. There is no significant stenosis or aneurysm. IMPRESSION: No acute intracranial abnormality. Normal MRA of Stephanie head. Stenosis of Stephanie dominant right transverse-sigmoid sinus junction as seen previously. Electronically Signed   By: Macy Mis M.D.   On: 11/08/2019 08:36   Mr Jeri Cos [id number] Contrast  Result Date: 11/08/2019 CLINICAL DATA:  Right pulsatile tinnitus, right transverse sinus stenosis, follow-up size of small sub EXAM: MRI HEAD WITHOUT AND WITH CONTRAST MRA HEAD WITHOUT CONTRAST TECHNIQUE: Multiplanar, multiecho pulse sequences of Stephanie brain and surrounding structures were obtained without and with intravenous contrast. Angiographic images of Stephanie head were obtained using MRA technique without contrast. CONTRAST:  56mL GADAVIST GADOBUTROL 1 MMOL/ML IV SOLN COMPARISON:  May 26, 2018 FINDINGS: MRI HEAD FINDINGS Brain: There is no acute infarction or intracranial hemorrhage. There is no intracranial mass, mass effect, edema,  hydrocephalus, or extra-axial fluid collection. No abnormal enhancement Vascular: Normal flow voids. As noted previously, there is a dominant right transverse sinus with stenosis at Stephanie junction of Stephanie transverse and sigmoid sinuses. Skull and upper cervical spine: Normal marrow signal. Sinuses/Orbits: Mild retained secretions within Stephanie left sphenoid sinus. Otherwise trace mucosal thickening. Orbits are unremarkable. Other: None. MRA HEAD FINDINGS Intracranial internal carotid arteries are patent. Middle and anterior cerebral arteries are patent. Intracranial vertebral arteries, basilar artery, posterior cerebral arteries are patent. There is no significant stenosis or aneurysm. IMPRESSION: No acute intracranial abnormality. Normal MRA of Stephanie head. Stenosis of Stephanie dominant right transverse-sigmoid sinus junction as seen previously. Electronically Signed   By: Macy Mis M.D.   On: 11/08/2019 08:36    Labs:  CBC: No results for input(s): WBC, HGB, HCT, PLT in Stephanie last 8760  hours.  COAGS: No results for input(s): INR, APTT in Stephanie last 8760 hours.  BMP: No results for input(s): NA, K, CL, CO2, GLUCOSE, BUN, CALCIUM, CREATININE, GFRNONAA, GFRAA in Stephanie last 8760 hours.  Invalid input(s): CMP  LIVER FUNCTION TESTS: No results for input(s): BILITOT, AST, ALT, ALKPHOS, PROT, ALBUMIN in Stephanie last 8760 hours.  TUMOR MARKERS: No results for input(s): AFPTM, CEA, CA199, CHROMGRNA in Stephanie last 8760 hours.  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   Dr. Estanislado Pandy was present for consultation.  [name] reported intermittent morning headaches last year during Stephanie [name] routine follow up with NIR, however these have become more frequent and now occasionally  wake Stephanie [name] up from sleep. They resolve with Tylenol and ambulation and do not recur throughout Stephanie day. Stephanie [name] tinnitus has remained essentially unchanged from Stephanie [name] perspective. Stephanie [name] has not known issues with HTN.   We discussed Stephanie results of Stephanie [name] MRI/MRA which show no significant change from previous exams, however given Stephanie [name] worsening symptoms there is concern that Stephanie stenosis may have worsened or that Stephanie [name] has developed venous hypertension. We discussed Stephanie etiology and natural trajectory of Stephanie [name] condition including increased chance of stroke or venous rupture. We discussed follow up options including continued MRI/MRA surveillance however increasing Stephanie frequency to every 6 months given worsening symptoms and weight loss/reduced caffeine use vs proceeding with cerebral angiogram again with intent to treat Stephanie known issues. Stephanie [name] and Stephanie [name] husband have requested some time to discuss these options - Stephanie [name] will call us with Stephanie [name] decision.  Plan for follow-up: 1. [name] to discuss continued options at home - MRI/MRA Q103months vs proceeding with endovascular treatment. Stephanie [name] will call our office next week with Stephanie [name] decision. 2. Maintain BP control 3. Weight loss/caffeine reduction 4. Maintain follow up with all other specialities as indicated 5. Present to Stephanie ED with any concerning s/s  Informed [name] that our schedulers will call [name] next week if we have not heard from Stephanie [name] regarding Stephanie [name] decision  All questions answered and concerns addressed.  [name] conveys understanding and agrees with plan.  Thank you for this interesting consult.  I greatly enjoyed meeting Stephanie [name] and look forward to participating in their care.  A copy of this report was sent to Stephanie requesting provider on this date.  Electronically Signed: Joaquim Nam, PA-C 11/18/2019, 11:55 AM   I spent a total of  25 Minutes in face to face in clinical consultation, greater than 50% of which was counseling/coordinating care for  follow up discussion of MRI/MRA results.

## 2019-12-01 ENCOUNTER — Other Ambulatory Visit (HOSPITAL_COMMUNITY): Payer: Self-pay | Admitting: Interventional Radiology

## 2019-12-01 ENCOUNTER — Other Ambulatory Visit: Payer: Self-pay | Admitting: Radiology

## 2019-12-01 ENCOUNTER — Telehealth (HOSPITAL_COMMUNITY): Payer: Self-pay | Admitting: Radiology

## 2019-12-01 DIAGNOSIS — I771 Stricture of artery: Secondary | ICD-10-CM

## 2019-12-01 MED ORDER — CLOPIDOGREL BISULFATE 75 MG PO TABS: 75.0000 mg | ORAL_TABLET | Freq: Every day | ORAL | 3 refills | Status: DC

## 2019-12-01 NOTE — Telephone Encounter (Signed)
Called pt, left VM for her to call to schedule intervention with Deveshwar JM

## 2019-12-08 ENCOUNTER — Other Ambulatory Visit (HOSPITAL_COMMUNITY)
Admission: RE | Admit: 2019-12-08 | Discharge: 2019-12-08 | Disposition: A | Discharge status: Home / Self Care | Payer: BC Managed Care – PPO | Source: Ambulatory Visit | Attending: Interventional Radiology | Admitting: Interventional Radiology

## 2019-12-08 DIAGNOSIS — Z01812 Encounter for preprocedural laboratory examination: Secondary | ICD-10-CM | POA: Insufficient documentation

## 2019-12-08 DIAGNOSIS — Z20828 Contact with and (suspected) exposure to other viral communicable diseases: Secondary | ICD-10-CM | POA: Insufficient documentation

## 2019-12-09 ENCOUNTER — Other Ambulatory Visit: Payer: Self-pay

## 2019-12-09 ENCOUNTER — Encounter (HOSPITAL_COMMUNITY): Payer: Self-pay | Admitting: Interventional Radiology

## 2019-12-09 ENCOUNTER — Other Ambulatory Visit: Payer: Self-pay | Admitting: Student

## 2019-12-09 LAB — NOVEL CORONAVIRUS, NAA (HOSP ORDER, SEND-OUT TO REF LAB; TAT 18-24 HRS): SARS-CoV-2, NAA: NOT DETECTED

## 2019-12-09 NOTE — Progress Notes (Signed)
Spoke with pt for pre-op call. Pt denies cardiac history, HTN or Diabetes. Pt states she is taking the Plavix and Aspirin as directed.  Covid test done on 12/08/19 - result is pending. Pt states she has been in quarantine since test was done and understand she needs to continue until day of surgery.  Pt informed of visitation policy and voiced understanding.

## 2019-12-10 DIAGNOSIS — J018 Other acute sinusitis: Secondary | ICD-10-CM | POA: Diagnosis not present

## 2019-12-11 NOTE — Anesthesia Preprocedure Evaluation (Addendum)
Anesthesia Evaluation  Patient identified by MRN, date of birth, ID band Patient awake    Reviewed: Allergy & Precautions, NPO status , Patient's Chart, lab work & pertinent test results  History of Anesthesia Complications Negative for: history of anesthetic complications  Airway Mallampati: II  TM Distance: >3 FB Neck ROM: Full    Dental  (+) Dental Advisory Given, Teeth Intact   Pulmonary neg pulmonary ROS,    Pulmonary exam normal        Cardiovascular negative cardio ROS Normal cardiovascular exam     Neuro/Psych PSYCHIATRIC DISORDERS Anxiety  Tinnitus     GI/Hepatic Neg liver ROS, GERD  Medicated and Controlled,  Endo/Other  Morbid obesity  Renal/GU negative Renal ROS     Musculoskeletal negative musculoskeletal ROS (+)   Abdominal   Peds  Hematology negative hematology ROS (+)   Anesthesia Other Findings Covid neg 12/17   Reproductive/Obstetrics                            Anesthesia Physical Anesthesia Plan  ASA: III  Anesthesia Plan: General   Post-op Pain Management:    Induction: Intravenous  PONV Risk Score and Plan: 3 and Treatment may vary due to age or medical condition, Ondansetron, Dexamethasone and Midazolam  Airway Management Planned: Oral ETT  Additional Equipment: Arterial line  Intra-op Plan:   Post-operative Plan: Extubation in OR  Informed Consent: I have reviewed the patients History and Physical, chart, labs and discussed the procedure including the risks, benefits and alternatives for the proposed anesthesia with the patient or authorized representative who has indicated his/her understanding and acceptance.     Dental advisory given  Plan Discussed with: CRNA and Anesthesiologist  Anesthesia Plan Comments: (May begin case as MAC for angiogram if surgeon prefers and convert to [place] as indicated)       Anesthesia Quick Evaluation

## 2019-12-12 ENCOUNTER — Ambulatory Visit (HOSPITAL_COMMUNITY): Payer: BC Managed Care – PPO

## 2019-12-12 ENCOUNTER — Encounter (HOSPITAL_COMMUNITY)
Admission: AD | Disposition: A | Discharge status: Home / Self Care | Payer: Self-pay | Source: Home / Self Care | Attending: Interventional Radiology

## 2019-12-12 ENCOUNTER — Encounter (HOSPITAL_COMMUNITY): Payer: Self-pay | Admitting: Interventional Radiology

## 2019-12-12 ENCOUNTER — Other Ambulatory Visit: Payer: Self-pay | Admitting: Radiology

## 2019-12-12 ENCOUNTER — Inpatient Hospital Stay (HOSPITAL_COMMUNITY)
Admission: AD | Admit: 2019-12-12 | Discharge: 2019-12-13 | DRG: 253 | Disposition: A | Discharge status: Home / Self Care | Payer: BC Managed Care – PPO | Attending: Interventional Radiology | Admitting: Interventional Radiology

## 2019-12-12 ENCOUNTER — Ambulatory Visit (HOSPITAL_COMMUNITY)
Admission: RE | Admit: 2019-12-12 | Discharge: 2019-12-12 | Disposition: A | Discharge status: Home / Self Care | Payer: BC Managed Care – PPO | Source: Ambulatory Visit | Attending: Interventional Radiology | Admitting: Interventional Radiology

## 2019-12-12 ENCOUNTER — Other Ambulatory Visit: Payer: Self-pay

## 2019-12-12 DIAGNOSIS — H93A1 Pulsatile tinnitus, right ear: Secondary | ICD-10-CM | POA: Diagnosis not present

## 2019-12-12 DIAGNOSIS — I871 Compression of vein: Principal | ICD-10-CM | POA: Diagnosis present

## 2019-12-12 DIAGNOSIS — Z6841 Body Mass Index (BMI) 40.0 and over, adult: Secondary | ICD-10-CM | POA: Diagnosis not present

## 2019-12-12 DIAGNOSIS — Z7982 Long term (current) use of aspirin: Secondary | ICD-10-CM | POA: Diagnosis not present

## 2019-12-12 DIAGNOSIS — Z9049 Acquired absence of other specified parts of digestive tract: Secondary | ICD-10-CM | POA: Diagnosis not present

## 2019-12-12 DIAGNOSIS — Z20828 Contact with and (suspected) exposure to other viral communicable diseases: Secondary | ICD-10-CM | POA: Diagnosis present

## 2019-12-12 DIAGNOSIS — F419 Anxiety disorder, unspecified: Secondary | ICD-10-CM | POA: Diagnosis present

## 2019-12-12 DIAGNOSIS — Z981 Arthrodesis status: Secondary | ICD-10-CM

## 2019-12-12 DIAGNOSIS — Z79899 Other long term (current) drug therapy: Secondary | ICD-10-CM

## 2019-12-12 DIAGNOSIS — I676 Nonpyogenic thrombosis of intracranial venous system: Secondary | ICD-10-CM | POA: Diagnosis not present

## 2019-12-12 DIAGNOSIS — R519 Headache, unspecified: Secondary | ICD-10-CM | POA: Diagnosis not present

## 2019-12-12 DIAGNOSIS — K8020 Calculus of gallbladder without cholecystitis without obstruction: Secondary | ICD-10-CM | POA: Diagnosis not present

## 2019-12-12 DIAGNOSIS — K219 Gastro-esophageal reflux disease without esophagitis: Secondary | ICD-10-CM | POA: Diagnosis present

## 2019-12-12 DIAGNOSIS — Z885 Allergy status to narcotic agent status: Secondary | ICD-10-CM | POA: Diagnosis not present

## 2019-12-12 DIAGNOSIS — I771 Stricture of artery: Secondary | ICD-10-CM

## 2019-12-12 DIAGNOSIS — M5116 Intervertebral disc disorders with radiculopathy, lumbar region: Secondary | ICD-10-CM | POA: Diagnosis not present

## 2019-12-12 DIAGNOSIS — Z7902 Long term (current) use of antithrombotics/antiplatelets: Secondary | ICD-10-CM

## 2019-12-12 HISTORY — DX: Gastro-esophageal reflux disease without esophagitis: K21.9

## 2019-12-12 HISTORY — PX: IR VENO/JUGULAR RIGHT: IMG2275

## 2019-12-12 HISTORY — PX: IR CT HEAD LTD: IMG2386

## 2019-12-12 HISTORY — DX: Pneumonia, unspecified organism: J18.9

## 2019-12-12 HISTORY — PX: RADIOLOGY WITH ANESTHESIA: SHX6223

## 2019-12-12 HISTORY — PX: IR TRANSCATH PLC STENT 1ST ART NOT LE CV CAR VERT CAR: IMG5443

## 2019-12-12 HISTORY — DX: Anxiety disorder, unspecified: F41.9

## 2019-12-12 HISTORY — PX: IR ANGIO INTRA EXTRACRAN SEL COM CAROTID INNOMINATE UNI R MOD SED: IMG5359

## 2019-12-12 LAB — URINALYSIS, COMPLETE (UACMP) WITH MICROSCOPIC
Bilirubin Urine: NEGATIVE
Glucose, UA: NEGATIVE mg/dL
Ketones, ur: NEGATIVE mg/dL
Leukocytes,Ua: NEGATIVE
Nitrite: NEGATIVE
Protein, ur: NEGATIVE mg/dL
Specific Gravity, Urine: 1.021 (ref 1.005–1.030)
pH: 5 (ref 5.0–8.0)

## 2019-12-12 LAB — CBC WITH DIFFERENTIAL/PLATELET
Abs Immature Granulocytes: 0.02 10*3/uL (ref 0.00–0.07)
Basophils Absolute: 0.1 10*3/uL (ref 0.0–0.1)
Basophils Relative: 1 %
Eosinophils Absolute: 0.3 10*3/uL (ref 0.0–0.5)
Eosinophils Relative: 4 %
HCT: 41.8 % (ref 36.0–46.0)
Hemoglobin: 13 g/dL (ref 12.0–15.0)
Immature Granulocytes: 0 %
Lymphocytes Relative: 21 %
Lymphs Abs: 1.6 10*3/uL (ref 0.7–4.0)
MCH: 28.2 pg (ref 26.0–34.0)
MCHC: 31.1 g/dL (ref 30.0–36.0)
MCV: 90.7 fL (ref 80.0–100.0)
Monocytes Absolute: 0.5 10*3/uL (ref 0.1–1.0)
Monocytes Relative: 7 %
Neutro Abs: 5.3 10*3/uL (ref 1.7–7.7)
Neutrophils Relative %: 67 %
Platelets: 394 10*3/uL (ref 150–400)
RBC: 4.61 MIL/uL (ref 3.87–5.11)
RDW: 13.7 % (ref 11.5–15.5)
WBC: 7.8 10*3/uL (ref 4.0–10.5)
nRBC: 0 % (ref 0.0–0.2)

## 2019-12-12 LAB — POCT ACTIVATED CLOTTING TIME
Activated Clotting Time: 186 seconds
Activated Clotting Time: 197 seconds
Activated Clotting Time: 213 seconds

## 2019-12-12 LAB — BASIC METABOLIC PANEL
Anion gap: 10 (ref 5–15)
BUN: 12 mg/dL (ref 6–20)
CO2: 23 mmol/L (ref 22–32)
Calcium: 8.7 mg/dL — ABNORMAL LOW (ref 8.9–10.3)
Chloride: 107 mmol/L (ref 98–111)
Creatinine, Ser: 0.73 mg/dL (ref 0.44–1.00)
GFR calc Af Amer: >60 mL/min (ref >60)
GFR calc non Af Amer: >60 mL/min (ref >60)
Glucose, Bld: 95 mg/dL (ref 70–99)
Potassium: 3.5 mmol/L (ref 3.5–5.1)
Sodium: 140 mmol/L (ref 135–145)

## 2019-12-12 LAB — MRSA PCR SCREENING: MRSA by PCR: NEGATIVE

## 2019-12-12 LAB — APTT: aPTT: 24 seconds (ref 24–36)

## 2019-12-12 LAB — PROTIME-INR
INR: 1 (ref 0.8–1.2)
Prothrombin Time: 12.9 seconds (ref 11.4–15.2)

## 2019-12-12 LAB — HEPARIN LEVEL (UNFRACTIONATED): Heparin Unfractionated: <0.1 IU/mL — ABNORMAL LOW (ref 0.30–0.70)

## 2019-12-12 LAB — PLATELET INHIBITION P2Y12: Platelet Function  P2Y12: 146 [PRU] — ABNORMAL LOW (ref 182–335)

## 2019-12-12 SURGERY — IR WITH ANESTHESIA
Anesthesia: General | Laterality: Bilateral

## 2019-12-12 MED ORDER — ASPIRIN 81 MG PO CHEW
CHEWABLE_TABLET | ORAL | Status: AC
  Filled 2019-12-12: qty 3

## 2019-12-12 MED ORDER — LIDOCAINE HCL 1 % IJ SOLN
INTRAMUSCULAR | Status: AC
  Filled 2019-12-12: qty 20

## 2019-12-12 MED ORDER — SODIUM CHLORIDE 0.9 % IV SOLN: INTRAVENOUS | Status: DC

## 2019-12-12 MED ORDER — HEPARIN SODIUM (PORCINE) 1000 UNIT/ML IJ SOLN
INTRAMUSCULAR | Status: DC | PRN
  Administered 2019-12-12 (×2): 1000 [IU] via INTRAVENOUS
  Administered 2019-12-12: 3000 [IU] via INTRAVENOUS

## 2019-12-12 MED ORDER — ASPIRIN 81 MG PO CHEW
CHEWABLE_TABLET | ORAL | Status: AC
  Administered 2019-12-12: 243 mg via ORAL
  Filled 2019-12-12: qty 3

## 2019-12-12 MED ORDER — HEPARIN (PORCINE) 25000 UT/250ML-% IV SOLN: 700.0000 [IU]/h | INTRAVENOUS | Status: DC

## 2019-12-12 MED ORDER — FENTANYL CITRATE (PF) 100 MCG/2ML IJ SOLN: 25.0000 ug | INTRAMUSCULAR | Status: DC | PRN

## 2019-12-12 MED ORDER — PROPOFOL 10 MG/ML IV BOLUS
INTRAVENOUS | Status: DC | PRN
  Administered 2019-12-12: 200 mg via INTRAVENOUS

## 2019-12-12 MED ORDER — IOHEXOL 300 MG/ML  SOLN
150.0000 mL | Freq: Once | INTRAMUSCULAR | Status: AC | PRN
  Administered 2019-12-12: 11:00:00 75 mL via INTRA_ARTERIAL

## 2019-12-12 MED ORDER — CLOPIDOGREL BISULFATE 75 MG PO TABS
75.0000 mg | ORAL_TABLET | Freq: Every day | ORAL | Status: DC
  Administered 2019-12-13: 75 mg via ORAL
  Filled 2019-12-12: qty 1

## 2019-12-12 MED ORDER — DEXAMETHASONE SODIUM PHOSPHATE 10 MG/ML IJ SOLN
INTRAMUSCULAR | Status: DC | PRN
  Administered 2019-12-12: 10 mg via INTRAVENOUS

## 2019-12-12 MED ORDER — ASPIRIN EC 325 MG PO TBEC
325.0000 mg | DELAYED_RELEASE_TABLET | ORAL | Status: DC
  Filled 2019-12-12: qty 1

## 2019-12-12 MED ORDER — ONDANSETRON HCL 4 MG/2ML IJ SOLN
INTRAMUSCULAR | Status: DC | PRN
  Administered 2019-12-12: 4 mg via INTRAVENOUS

## 2019-12-12 MED ORDER — PHENYLEPHRINE HCL-NACL 10-0.9 MG/250ML-% IV SOLN
INTRAVENOUS | Status: DC | PRN
  Administered 2019-12-12: 15 ug/min via INTRAVENOUS

## 2019-12-12 MED ORDER — SUGAMMADEX SODIUM 200 MG/2ML IV SOLN
INTRAVENOUS | Status: DC | PRN
  Administered 2019-12-12: 200 mg via INTRAVENOUS

## 2019-12-12 MED ORDER — LIDOCAINE 2% (20 MG/ML) 5 ML SYRINGE
INTRAMUSCULAR | Status: DC | PRN
  Administered 2019-12-12: 100 mg via INTRAVENOUS

## 2019-12-12 MED ORDER — CLOPIDOGREL BISULFATE 75 MG PO TABS
75.0000 mg | ORAL_TABLET | ORAL | Status: AC
  Administered 2019-12-12: 75 mg via ORAL
  Filled 2019-12-12: qty 1

## 2019-12-12 MED ORDER — HEPARIN (PORCINE) 25000 UT/250ML-% IV SOLN
INTRAVENOUS | Status: AC
  Filled 2019-12-12: qty 250

## 2019-12-12 MED ORDER — CEFAZOLIN SODIUM-DEXTROSE 2-4 GM/100ML-% IV SOLN
INTRAVENOUS | Status: AC
  Filled 2019-12-12: qty 100

## 2019-12-12 MED ORDER — HEPARIN (PORCINE) 25000 UT/250ML-% IV SOLN
500.0000 [IU]/h | INTRAVENOUS | Status: DC
  Administered 2019-12-12: 500 [IU]/h via INTRAVENOUS

## 2019-12-12 MED ORDER — LACTATED RINGERS IV SOLN: INTRAVENOUS | Status: DC | PRN

## 2019-12-12 MED ORDER — CLEVIDIPINE BUTYRATE 0.5 MG/ML IV EMUL
0.0000 mg/h | INTRAVENOUS | Status: DC
  Administered 2019-12-12 (×2): 8 mg/h via INTRAVENOUS
  Administered 2019-12-12: 13:00:00 4 mg/h via INTRAVENOUS
  Administered 2019-12-13 (×2): 8 mg/h via INTRAVENOUS
  Filled 2019-12-12 (×7): qty 50

## 2019-12-12 MED ORDER — CLOPIDOGREL BISULFATE 75 MG PO TABS: 75.0000 mg | ORAL_TABLET | Freq: Every day | ORAL | Status: DC

## 2019-12-12 MED ORDER — ASPIRIN 81 MG PO CHEW: 324.0000 mg | CHEWABLE_TABLET | Freq: Every day | ORAL | Status: DC

## 2019-12-12 MED ORDER — PROMETHAZINE HCL 25 MG/ML IJ SOLN: 6.2500 mg | INTRAMUSCULAR | Status: DC | PRN

## 2019-12-12 MED ORDER — ACETAMINOPHEN 650 MG RE SUPP: 650.0000 mg | RECTAL | Status: DC | PRN

## 2019-12-12 MED ORDER — MIDAZOLAM HCL 5 MG/5ML IJ SOLN
INTRAMUSCULAR | Status: DC | PRN
  Administered 2019-12-12 (×2): 1 mg via INTRAVENOUS

## 2019-12-12 MED ORDER — HEPARIN (PORCINE) 25000 UT/250ML-% IV SOLN: 800.0000 [IU]/h | INTRAVENOUS | Status: DC

## 2019-12-12 MED ORDER — NITROGLYCERIN 1 MG/10 ML FOR IR/CATH LAB
INTRA_ARTERIAL | Status: AC
  Filled 2019-12-12: qty 10

## 2019-12-12 MED ORDER — CHLORHEXIDINE GLUCONATE CLOTH 2 % EX PADS
6.0000 | MEDICATED_PAD | Freq: Every day | CUTANEOUS | Status: DC
  Administered 2019-12-12: 6 via TOPICAL

## 2019-12-12 MED ORDER — ASPIRIN 325 MG PO TABS
325.0000 mg | ORAL_TABLET | Freq: Every day | ORAL | Status: DC
  Administered 2019-12-13: 10:00:00 325 mg via ORAL
  Filled 2019-12-12: qty 1

## 2019-12-12 MED ORDER — OXYCODONE HCL 5 MG PO TABS: 5.0000 mg | ORAL_TABLET | Freq: Once | ORAL | Status: DC | PRN

## 2019-12-12 MED ORDER — ONDANSETRON HCL 4 MG/2ML IJ SOLN: 4.0000 mg | Freq: Four times a day (QID) | INTRAMUSCULAR | Status: DC | PRN

## 2019-12-12 MED ORDER — CEFAZOLIN SODIUM-DEXTROSE 2-4 GM/100ML-% IV SOLN
2.0000 g | Freq: Once | INTRAVENOUS | Status: AC
  Administered 2019-12-12: 2 g via INTRAVENOUS

## 2019-12-12 MED ORDER — NIMODIPINE 30 MG PO CAPS
0.0000 mg | ORAL_CAPSULE | ORAL | Status: AC
  Administered 2019-12-12: 60 mg via ORAL
  Filled 2019-12-12: qty 2

## 2019-12-12 MED ORDER — OXYCODONE HCL 5 MG/5ML PO SOLN: 5.0000 mg | Freq: Once | ORAL | Status: DC | PRN

## 2019-12-12 MED ORDER — FENTANYL CITRATE (PF) 100 MCG/2ML IJ SOLN
INTRAMUSCULAR | Status: DC | PRN
  Administered 2019-12-12 (×4): 50 ug via INTRAVENOUS

## 2019-12-12 MED ORDER — ASPIRIN 81 MG PO CHEW: 244.0000 mg | CHEWABLE_TABLET | Freq: Once | ORAL | Status: AC

## 2019-12-12 MED ORDER — ASPIRIN 81 MG PO TBEC: 244.0000 mg | DELAYED_RELEASE_TABLET | ORAL | Status: DC

## 2019-12-12 MED ORDER — ROCURONIUM BROMIDE 50 MG/5ML IV SOSY
PREFILLED_SYRINGE | INTRAVENOUS | Status: DC | PRN
  Administered 2019-12-12 (×2): 50 mg via INTRAVENOUS

## 2019-12-12 MED ORDER — ACETAMINOPHEN 160 MG/5ML PO SOLN: 650.0000 mg | ORAL | Status: DC | PRN

## 2019-12-12 MED ORDER — ACETAMINOPHEN 325 MG PO TABS
650.0000 mg | ORAL_TABLET | ORAL | Status: DC | PRN
  Administered 2019-12-12 – 2019-12-13 (×5): 650 mg via ORAL
  Filled 2019-12-12 (×5): qty 2

## 2019-12-12 NOTE — Progress Notes (Signed)
Patient ID: Stephanie [name], female   DOB: [birth year], 48 y.o.   MRN: [id number] Post procedure extubated. Responds  appropriately.  Denies any H/As,N/V visual or motor symptoms..Oriented to place. Pupils 7mm Rt = LT No facial asymmetry. Moves all 4 equally to command. No pronation drift. Both groins soft.Distal pulses dopplerable DPs and PTs bilaterally unchanged. S.Riannon Mukherjee MD

## 2019-12-12 NOTE — Anesthesia Procedure Notes (Signed)
Arterial Line Insertion Start/End12/21/2020 8:50 AM, 12/12/2019 9:00 AM Performed by: Alain Marion, CRNA  Patient location: Pre-op. Preanesthetic checklist: patient identified, IV checked, site marked, risks and benefits discussed, surgical consent, monitors and equipment checked, pre-op evaluation, timeout performed and anesthesia consent Lidocaine 1% used for infiltration Left, radial was placed Catheter size: 20 G Hand hygiene performed  and maximum sterile barriers used   Attempts: 2 (Attempted by SRNA, placed by CRNA) Procedure performed without using ultrasound guided technique. Post procedure assessment: normal  Patient tolerated the procedure well with no immediate complications.

## 2019-12-12 NOTE — H&P (Signed)
Chief Complaint: Patient was seen in consultation today for cerebral arteriogram with possible intervention  at the request of Dr Fredirick Maudlin   Supervising Physician: Luanne Bras  Patient Status: [facility] - Out-pt  History of Present Illness: Stephanie [name] is a 48 y.o. 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  MR 11/07/19: MRA HEAD FINDINGS Intracranial internal carotid arteries are patent. Middle and anterior cerebral arteries are patent. Intracranial vertebral arteries, basilar artery, posterior cerebral arteries are patent. There is no significant stenosis or aneurysm. IMPRESSION: No acute intracranial abnormality.  Normal MRA of the head. Stenosis of the dominant right transverse-sigmoid sinus junction as seen previously.   Pt states for past 6 mo she has had more headaches and tinnitus on Rt side. Headaches rt side of head Waking her up from sleep Denies N/V Denies numbness or tingling Denies dizziness Tylenol resolves headache usually   Consult with Dr Estanislado Pandy 11/18/19: Patient reported intermittent morning headaches last year during her routine follow up with NIR, however these have become more frequent and now occasionally wake her up from sleep. They resolve with Tylenol and ambulation and do not recur throughout the day. Her tinnitus has remained essentially unchanged from her perspective. She has not known issues with HTN. We discussed the results of her MRI/MRA which show no significant change from previous exams, however given her worsening symptoms there is concern that the stenosis may have worsened or that she has developed venous hypertension. We discussed the etiology and natural trajectory of her condition including increased chance of stroke or venous rupture. We discussed follow up options  including continued MRI/MRA surveillance however increasing the frequency to every 6 months given worsening symptoms and weight loss/reduced caffeine use vs proceeding with cerebral angiogram again with intent to treat the known issues.  She has called IR to scheduled cerebral arteriogram and possible intervention for today  Past Medical History:  Diagnosis Date  . Anxiety   . GERD (gastroesophageal reflux disease)   . Menorrhagia   . Pneumonia   . Wears contact lenses     Past Surgical History:  Procedure Laterality Date  . ANTERIOR CERVICAL DECOMP/DISCECTOMY FUSION  06/ 2015   C4 -- C5  . BACK SURGERY    . CESAREAN SECTION  03-29-2002/  1997/  1994  . CHOLECYSTECTOMY  08/12/2012   Procedure: LAPAROSCOPIC CHOLECYSTECTOMY WITH INTRAOPERATIVE CHOLANGIOGRAM;  Surgeon: Earnstine Regal, MD;  Location: [facility];  Service: General;  Laterality: N/A;  . DILITATION & CURRETTAGE/HYSTROSCOPY WITH NOVASURE ABLATION N/A 10/26/2014   Procedure: DILATATION & CURETTAGE/HYSTEROSCOPY WITH ATTEMPTED NOVASURE ABLATION WITH IUD REMOVAL;  Surgeon: Maisie Fus, MD;  Location: [place];  Service: Gynecology;  Laterality: N/A;  . INTRAUTERINE DEVICE (IUD) INSERTION  2010  . IR RADIOLOGIST EVAL & MGMT  06/22/2018  . LAPAROSCOPIC TUBAL LIGATION Bilateral 10/26/2014   Procedure: LAPAROSCOPIC TUBAL LIGATION;  Surgeon: Maisie Fus, MD;  Location: [facility];  Service: Gynecology;  Laterality: Bilateral;  . LUMBAR LAMINECTOMY/DECOMPRESSION MICRODISCECTOMY  02/10/2012   Procedure: LUMBAR LAMINECTOMY/DECOMPRESSION MICRODISCECTOMY 1 LEVEL;  Surgeon: Charlie Pitter, MD;  Location: [place];  Service: Neurosurgery;  Laterality: Left;  Left Lumbar Four-Five Laminectomy and Microdiskectomy  . REMOVAL BENIGN THROAT CYST  age 41    Allergies: Morphine and related  Medications: Prior to Admission medications  Medication Sig Start Date End Date Taking? Authorizing Provider  aspirin EC 81  MG tablet Take 81 mg by mouth daily.   Yes [provider]  buPROPion (WELLBUTRIN XL) 150 MG 24 hr tablet Take 150 mg by mouth daily.   Yes [provider]  clopidogrel (PLAVIX) 75 MG tablet Take 1 tablet (75 mg total) by mouth daily. 12/01/19  Yes Bruning, Lennette Bihari, PA-C  omeprazole (PRILOSEC) 40 MG capsule Take 40 mg by mouth daily.   Yes [provider]  sertraline (ZOLOFT) 100 MG tablet Take 100 mg by mouth daily.   Yes [provider]     Family History  Problem Relation Age of Onset  . Anesthesia problems Mother     Social History   Socioeconomic History  . Marital status: Married    Spouse name: Not on file  . Number of children: Not on file  . Years of education: Not on file  . Highest education level: Not on file  Occupational History  . Occupation: Programme researcher, broadcasting/film/video: [employer]  Tobacco Use  . Smoking status: Never Smoker  . Smokeless tobacco: Never Used  Substance and Sexual Activity  . Alcohol use: No  . Drug use: No  . Sexual activity: Yes    Birth control/protection: I.U.D.  Other Topics Concern  . Not on file  Social History Narrative  . Not on file   Social Determinants of Health   Financial Resource Strain:   . Difficulty of Paying Living Expenses: Not on file  Food Insecurity:   . Worried About Charity fundraiser in the Last Year: Not on file  . Ran Out of Food in the Last Year: Not on file  Transportation Needs:   . Lack of Transportation (Medical): Not on file  . Lack of Transportation (Non-Medical): Not on file  Physical Activity:   . Days of Exercise per Week: Not on file  . Minutes of Exercise per Session: Not on file  Stress:   . Feeling of Stress : Not on file  Social Connections:   . Frequency of Communication with Friends and Family: Not on file  . Frequency of Social Gatherings with Friends and Family: Not on file  . Attends Religious Services: Not on file  . Active Member of Clubs or  Organizations: Not on file  . Attends Archivist Meetings: Not on file  . Marital Status: Not on file    Review of Systems: A 12 point ROS discussed and pertinent positives are indicated in the HPI above.  All other systems are negative.  +Rt tinnitus Denies hearing loss Denies N/V/D Denies abd pain Denies dizziness;weakness Behavior and cognition wnl  Vital Signs: BP (!) 150/80   Pulse 87   Temp 98.5 F (36.9 C) (Oral)   Resp 18   Ht 5\' 4"  (1.626 m)   Wt 260 lb (117.9 kg)   LMP 07/12/2012   SpO2 99%   BMI 44.63 kg/m   Physical Exam Vitals reviewed.  Constitutional:      Appearance: Normal appearance.  HENT:     Head: Atraumatic.     Comments: Face is symmetrical Tongue midline    Mouth/Throat:     Mouth: Mucous membranes are moist.  Eyes:     Extraocular Movements: Extraocular movements intact.  Cardiovascular:     Rate and Rhythm: Normal rate and regular rhythm.     Heart sounds: Normal heart sounds.  Pulmonary:  Effort: Pulmonary effort is normal.     Breath sounds: Normal breath sounds.  Abdominal:     Palpations: Abdomen is soft.     Tenderness: There is no abdominal tenderness.  Musculoskeletal:        General: Normal range of motion.  Skin:    General: Skin is warm and dry.  Neurological:     Mental Status: She is alert and oriented to person, place, and time.  Psychiatric:        Mood and Affect: Mood normal.        Behavior: Behavior normal.        Thought Content: Thought content normal.        Judgment: Judgment normal.     Imaging: No results found.  Labs:  CBC: Recent Labs    12/12/19 0633  WBC 7.8  HGB 13.0  HCT 41.8  PLT 394    COAGS: No results for input(s): INR, APTT in the last 8760 hours.  BMP: No results for input(s): NA, K, CL, CO2, GLUCOSE, BUN, CALCIUM, CREATININE, GFRNONAA, GFRAA in the last 8760 hours.  Invalid input(s): CMP  LIVER FUNCTION TESTS: No results for input(s): BILITOT, AST, ALT,  ALKPHOS, PROT, ALBUMIN in the last 8760 hours.  TUMOR MARKERS: No results for input(s): AFPTM, CEA, CA199, CHROMGRNA in the last 8760 hours.  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  This interventional procedure involves the use of X-rays and because of the nature of the planned procedure, it is possible that we will have prolonged use of X-ray fluoroscopy.  Potential radiation risks to you include (but are not limited to) the following: - A slightly elevated risk for cancer  several years later in life. This risk is typically less than 0.5% percent. This risk is low in comparison to the normal incidence of human cancer, which is 33% for women and 50% for men according to the [place]. - Radiation induced injury can include skin redness, resembling a rash, tissue breakdown / ulcers and hair loss (which can be temporary or permanent).   The likelihood of either of these occurring depends on the difficulty of the procedure and whether you are sensitive to radiation due to previous procedures, disease, or genetic conditions.   IF your procedure requires a prolonged use of radiation, you will be notified and given written instructions for further action.  It is your responsibility to monitor the irradiated area for the 2 weeks  following the procedure and to notify your physician if you are concerned that you have suffered a radiation induced injury.    All of the patient's questions were answered, patient is agreeable to proceed.  Consent signed and in chart.  Thank you for this interesting consult.  I greatly enjoyed meeting Tomeka L Runion and look forward to participating in their care.  A copy of this report was sent to the requesting provider on this  date.  Electronically Signed: Lavonia Drafts, PA-C 12/12/2019, 7:28 AM   I spent a total of    25 Minutes in face to face in clinical consultation, greater than 50% of which was counseling/coordinating care for cerebral arteriogram with possible intervention

## 2019-12-12 NOTE — Anesthesia Postprocedure Evaluation (Signed)
Anesthesia Post Note  Patient: Stephanie [name]  Procedure(s) Performed: STENTING (Bilateral )     Patient location during evaluation: PACU Anesthesia Type: General Level of consciousness: awake and alert Pain management: pain level controlled Vital Signs Assessment: post-procedure vital signs reviewed and stable Respiratory status: spontaneous breathing, nonlabored ventilation and respiratory function stable Cardiovascular status: blood pressure returned to baseline and stable Postop Assessment: no apparent nausea or vomiting Anesthetic complications: no    Last Vitals:  Vitals:   12/12/19 1400 12/12/19 1415  BP:    Pulse: 78 80  Resp: 16 14  Temp:    SpO2: 94% 95%    Last Pain:  Vitals:   12/12/19 1300  TempSrc:   PainSc: [place]

## 2019-12-12 NOTE — Progress Notes (Signed)
ANTICOAGULATION CONSULT NOTE - Initial Consult  Pharmacy Consult for Heparin Indication: s/p neuro-intereventional radiological procedure   Patient Measurements: Height: 5\' 4"  (162.6 cm) Weight: 260 lb (117.9 kg) IBW/kg (Calculated) : 54.7 Heparin Dosing Weight: 83.2 kg  Vital Signs: Temp: 98 F (36.7 C) (12/21 2000) Temp Source: Oral (12/21 2000) BP: 129/103 (12/21 2000) Pulse Rate: 94 (12/21 2000)  Labs: Recent Labs    12/12/19 0633 12/12/19 1948  HGB 13.0  --   HCT 41.8  --   PLT 394  --   APTT 24  --   LABPROT 12.9  --   INR 1.0  --   HEPARINUNFRC  --  <0.10*  CREATININE 0.73  --      Medical History: Past Medical History:  Diagnosis Date  . Anxiety   . GERD (gastroesophageal reflux disease)   . Menorrhagia   . Pneumonia   . Wears contact lenses      Assessment: Pt was seen as an outpatient for intermittent morning headaches and tinnitus. She was admitted for cerebral arteriogram which resulted in angioplasty and stenting. Heparin was started post-procedure and will be increased to the weight appropriate dose.   Heparin level ~5.5 hrs after initiation of heparin at 700 units/hr was <0.10 units/ml, which is below the goal range for this pt. CBC WNL. Per RN, no issues with IV or bleeding observed.  Goal of Therapy:  Heparin level 0.1-0.25 units/ml Monitor platelets by anticoagulation protocol: Yes    Plan:  -Increase heparin to 800 units/hr -Check 6-hr heparin level -Monitor daily HL, CBC -Monitor for signs/symptoms of bleeding   Gillermina Hu, PharmD, BCPS, [facility] Clinical Pharmacist 12/12/2019,8:52 PM

## 2019-12-12 NOTE — Sedation Documentation (Signed)
Pt under the care of Ansesthesia

## 2019-12-12 NOTE — Progress Notes (Signed)
Patient ID: Stephanie [name], female   DOB: December 20, 1971, 48 y.o.   MRN: [id number] INR. Patient with known RT TS/SS severe stenosis and LT TS/SS stenosis . Reports worsening H/As,in freq and duration over the past few months. H/A s usually generlizes waking her up at night. Also repors more pronounced RT sided pulsatile tinnitus and RT retromastoid discomort. Denies any visual or hearing changes. Otherwise systemically denies any symptomes pertaining to CVS,RESP,GI or URINARY symptoms. On aspirin and plavix.  O/E non focal neurologically.. Procedure,reasons and risks discussed. Risks of ICH/SAH an Sinus thrombosis with potential for worsening neuro decline and death. Also procedure may be terminated depending on the angiographic findings. Patient expressed understanding and consented to proceed. S.George Haggart MD

## 2019-12-12 NOTE — Transfer of Care (Addendum)
Immediate Anesthesia Transfer of Care Note  Patient: Stephanie [name]  Procedure(s) Performed: STENTING (Bilateral )  Patient Location: PACU  Anesthesia Type:General  Level of Consciousness: awake, alert  and oriented  Airway & Oxygen Therapy: Patient Spontanous Breathing and Patient connected to face mask oxygen  Post-op Assessment: Report given to RN and Post -op Vital signs reviewed and stable  Post vital signs: Reviewed and stable  Last Vitals:  Vitals Value Taken Time  BP 118/69 12/12/19 1330  Temp    Pulse 86 12/12/19 1340  Resp 16 12/12/19 1340  SpO2 96 % 12/12/19 1340  Vitals shown include unvalidated device data.  Last Pain:  Vitals:   12/12/19 1232  TempSrc:   PainSc: 0-No pain         Complications: No apparent anesthesia complications

## 2019-12-12 NOTE — Procedures (Addendum)
S/P RT common ,RT Vaert angiogram ,and RT sigmoid sinus venogram followed by angioplasty and stenting of dominant RT TS/SSS junction severe stenosis. Post CT brain NO ICH or mass effect seen. S.Inga Noller MD

## 2019-12-12 NOTE — Anesthesia Procedure Notes (Signed)
Procedure Name: Intubation Date/Time: 12/12/2019 9:29 AM Performed by: Alain Marion, CRNA Pre-anesthesia Checklist: Patient identified, Emergency Drugs available, Suction available and Patient being monitored Patient Re-evaluated:Patient Re-evaluated prior to induction Oxygen Delivery Method: Circle System Utilized Preoxygenation: Pre-oxygenation with 100% oxygen Induction Type: IV induction Ventilation: Mask ventilation without difficulty Laryngoscope Size: Miller and 2 Grade View: Grade I Tube type: Oral Number of attempts: 2 (attempted by SRNA placed by CRNA) Airway Equipment and Method: Stylet Placement Confirmation: ETT inserted through vocal cords under direct vision,  positive ETCO2 and breath sounds checked- equal and bilateral Secured at: 22 cm Tube secured with: Tape Dental Injury: Teeth and Oropharynx as per pre-operative assessment

## 2019-12-12 NOTE — Progress Notes (Signed)
ANTICOAGULATION CONSULT NOTE - Initial Consult  Pharmacy Consult for heparin Indication: s/p neuro-intereventional radiological procedure   Patient Measurements: Height: 5\' 4"  (162.6 cm) Weight: 260 lb (117.9 kg) IBW/kg (Calculated) : 54.7 Heparin Dosing Weight: 83.2 kg  Vital Signs: Temp: 98.2 F (36.8 C) (12/21 1232) Temp Source: Oral (12/21 0643) BP: 143/76 (12/21 1232) Pulse Rate: 76 (12/21 1232)  Labs: Recent Labs    12/12/19 0633  HGB 13.0  HCT 41.8  PLT 394  APTT 24  LABPROT 12.9  INR 1.0  CREATININE 0.73     Medical History: Past Medical History:  Diagnosis Date  . Anxiety   . GERD (gastroesophageal reflux disease)   . Menorrhagia   . Pneumonia   . Wears contact lenses      Assessment: Pt was seen as an outpatient for intermittent morning headaches and tinnitus. She was admitted for cerebral arteriogram which resulted in angioplasty and stenting. Heparin was started post-procedure and will be increased to the weight appropriate dose.   Goal of Therapy:  Heparin level 0.1-0.25 units/ml Monitor platelets by anticoagulation protocol: Yes    Plan:  -Increase heparin to 700 units/hr -Daily HL, CBC -Check level this evening   Harvel Quale 12/12/2019,1:49 PM

## 2019-12-13 ENCOUNTER — Other Ambulatory Visit: Payer: Self-pay | Admitting: Radiology

## 2019-12-13 ENCOUNTER — Encounter: Payer: Self-pay | Admitting: *Deleted

## 2019-12-13 DIAGNOSIS — I771 Stricture of artery: Secondary | ICD-10-CM

## 2019-12-13 LAB — HEPARIN LEVEL (UNFRACTIONATED): Heparin Unfractionated: <0.1 IU/mL — ABNORMAL LOW (ref 0.30–0.70)

## 2019-12-13 LAB — CBC WITH DIFFERENTIAL/PLATELET
Abs Immature Granulocytes: 0.03 10*3/uL (ref 0.00–0.07)
Basophils Absolute: 0 10*3/uL (ref 0.0–0.1)
Basophils Relative: 0 %
Eosinophils Absolute: 0 10*3/uL (ref 0.0–0.5)
Eosinophils Relative: 0 %
HCT: 37.4 % (ref 36.0–46.0)
Hemoglobin: 11.9 g/dL — ABNORMAL LOW (ref 12.0–15.0)
Immature Granulocytes: 0 %
Lymphocytes Relative: 13 %
Lymphs Abs: 1.3 10*3/uL (ref 0.7–4.0)
MCH: 28.6 pg (ref 26.0–34.0)
MCHC: 31.8 g/dL (ref 30.0–36.0)
MCV: 89.9 fL (ref 80.0–100.0)
Monocytes Absolute: 0.5 10*3/uL (ref 0.1–1.0)
Monocytes Relative: 5 %
Neutro Abs: 8.6 10*3/uL — ABNORMAL HIGH (ref 1.7–7.7)
Neutrophils Relative %: 82 %
Platelets: 374 10*3/uL (ref 150–400)
RBC: 4.16 MIL/uL (ref 3.87–5.11)
RDW: 13.7 % (ref 11.5–15.5)
WBC: 10.5 10*3/uL (ref 4.0–10.5)
nRBC: 0 % (ref 0.0–0.2)

## 2019-12-13 LAB — BASIC METABOLIC PANEL
Anion gap: 9 (ref 5–15)
BUN: 7 mg/dL (ref 6–20)
CO2: 22 mmol/L (ref 22–32)
Calcium: 8.4 mg/dL — ABNORMAL LOW (ref 8.9–10.3)
Chloride: 108 mmol/L (ref 98–111)
Creatinine, Ser: 0.57 mg/dL (ref 0.44–1.00)
GFR calc Af Amer: >60 mL/min (ref >60)
GFR calc non Af Amer: >60 mL/min (ref >60)
Glucose, Bld: 132 mg/dL — ABNORMAL HIGH (ref 70–99)
Potassium: 3.5 mmol/L (ref 3.5–5.1)
Sodium: 139 mmol/L (ref 135–145)

## 2019-12-13 MED ORDER — HEPARIN (PORCINE) 25000 UT/250ML-% IV SOLN: 900.0000 [IU]/h | INTRAVENOUS | Status: AC

## 2019-12-13 MED ORDER — ASPIRIN 325 MG PO TABS: 325.0000 mg | ORAL_TABLET | Freq: Every day | ORAL | Status: AC

## 2019-12-13 NOTE — Progress Notes (Signed)
ANTICOAGULATION CONSULT NOTE  Pharmacy Consult for Heparin Indication: s/p neuro-intereventional radiological procedure   Patient Measurements: Height: 5\' 4"  (162.6 cm) Weight: 260 lb (117.9 kg) IBW/kg (Calculated) : 54.7 Heparin Dosing Weight: 83.2 kg  Vital Signs: Temp: 98.1 F (36.7 C) (12/22 0000) Temp Source: Oral (12/22 0000) BP: 125/67 (12/22 0000) Pulse Rate: 78 (12/22 0000)  Labs: Recent Labs    12/12/19 0633 12/12/19 1948 12/13/19 0259  HGB 13.0  --  11.9*  HCT 41.8  --  37.4  PLT 394  --  374  APTT 24  --   --   LABPROT 12.9  --   --   INR 1.0  --   --   HEPARINUNFRC  --  <0.10* <0.10*  CREATININE 0.73  --  0.57    Assessment: 48 y.o. female s/p Rt TS/SSS stenting for heparin  Goal of Therapy:  Heparin level 0.1-0.25 units/ml Monitor platelets by anticoagulation protocol: Yes    Plan:  Increase Heparin 900 units/hr  Phillis Knack, PharmD, BCPS  12/13/2019,3:53 AM

## 2019-12-13 NOTE — Plan of Care (Signed)
Pt adequate/meets all requirements in care plan for discharge.

## 2019-12-13 NOTE — Discharge Instructions (Signed)
Cerebral Angiogram, Care After This sheet gives you information about how to care for yourself after your procedure. Your health care provider may also give you more specific instructions. If you have problems or questions, contact your health care provider. What can I expect after the procedure? After your procedure, it is common to have:  Bruising and tenderness at the catheter insertion site.  A mild headache. Follow these instructions at home: Insertion site care   Follow instructions from your health care provider about how to take care of the insertion site. Make sure you: ? Wash your hands with soap and water before you change your bandage (dressing). If soap and water are not available, use hand sanitizer. ? Change your dressing as told by your health care provider. ? Leave stitches (sutures), skin glue, or adhesive strips in place. These skin closures may need to stay in place for 2 weeks or longer. If adhesive strip edges start to loosen and curl up, you may trim the loose edges. Do not remove adhesive strips completely unless your health care provider tells you to do that.  Do not apply powder or lotion to the site.  Do not take baths, swim, or use a hot tub until your health care provider says it is okay to do so.  You may shower 24-48 hours after the procedure or as told by your health care provider. ? Gently wash the site with plain soap and water. ? Pat the area dry with a clean towel. ? Do not rub the site. This may cause bleeding.  Check your incision area every day for signs of infection. Check for: ? Redness, swelling, or pain. ? Fluid or blood. ? Warmth. ? Pus or a bad smell. Activity  Rest as told by your health care provider.  Do not lift anything that is heavier than 10 lb (4.5 kg), or the limit that your health care provider tells you, until he or she says that it is safe.  Do not drive for 24 hours if you were given a medicine to help you relax  (sedative).  Do not drive or use heavy machinery while taking prescription pain medicine. General instructions   Return to your normal activities as told by your health care provider. Ask your health care provider what activities are safe for you.  If the catheter site starts to bleed, lie flat and put pressure on the site.  Drink enough fluid to keep your urine clear or pale yellow. This helps flush the contrast dye from your body.  Take over-the-counter and prescription medicines only as told by your health care provider.  Keep all follow-up visits as directed by your health care provider. This is important. Contact a health care provider if:  You have a fever or chills.  You have redness, swelling, or more pain around your insertion site.  You have fluid or blood coming from your insertion site.  The insertion site feels warm to the touch.  You have pus or a bad smell coming from your insertion site.  You have more bruising around the insertion site.  Blood collects in the tissue around the catheter site (hematoma), and the hematoma may be painful to the touch. Get help right away if:  You have vision changes or loss of vision.  The catheter insertion area swells very fast.  You have numbness or weakness on one side of your body.  You have trouble talking, or you have slurred speech or cannot speak (aphasia).  You feel confused or have trouble remembering.  You have severe pain at the catheter insertion area.  The catheter insertion area is bleeding, and the bleeding does not stop after 30 minutes of holding steady pressure on the site.  The arm or leg where the catheter was inserted is numb, tingling, or cold.  You have chest pain.  You have trouble breathing.  You have a rash.  You have trouble using the arm or leg where the catheter was inserted. These symptoms may represent a serious problem that is an emergency. Do not wait to see if the symptoms will go  away. Get medical help right away. Call your local emergency services (911 in [place] Do not drive yourself to the hospital. Summary  After your procedure, it is common to have bruising and tenderness at the site where the catheter was inserted.  If your catheter insertion site begins to bleed, put direct pressure on it until the bleeding stops.  After your procedure, it is important to rest and drink plenty of fluids.  Return to your normal activities as told by your health care provider. Ask your health care provider what activities are safe for you. This information is not intended to replace advice given to you by your health care provider. Make sure you discuss any questions you have with your health care provider. Document Released: 04/24/2014 Document Revised: 11/20/2017 Document Reviewed: 01/12/2017 Elsevier Patient Education  2020 [employer].

## 2019-12-13 NOTE — Discharge Summary (Signed)
Physician Discharge Summary      Patient ID: Stephanie [name] MRN: [id number] DOB/AGE: [birth year] 50 y.o.  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 Sent to neuro ICU in stable condition. Pt only c/o is mild 3/10 headache which resolves with Tylenol. No other neurologic complaints. Heparin was turned off, BP stable. She is determined to be stable for discharge on 12/22. All medications, instructions , and follow up plans reviewed.  Consults: None   Discharge Exam: Blood pressure 116/70, pulse 86, temperature 98.5 F (36.9 C), temperature source Oral, resp. rate 18, height 5\' 4"  (1.626 m), weight 117.9 kg, last menstrual period 07/12/2012, SpO2 92 %. General: NAD Lungs: CTA without w/r/r Heart: Regular Neuro: A&O x 3 Face symmetric, tongue midline Fine motor intact, strength 5/5 throughout Ext: Rt groin(venous) soft, NT LT groin (arterial) soft, NT, no bleeding. Feet warm, good pedal pulses by doppler   Disposition: Discharge disposition: 01-Home or Self Care       Discharge Instructions    Call MD for:  difficulty breathing, headache or visual disturbances   Complete by: As directed    Call MD for:  persistant nausea and vomiting   Complete by: As directed    Call MD for:  redness, tenderness, or signs of infection (pain, swelling, redness, odor or green/yellow discharge around incision site)   Complete by: As directed    Call MD for:  severe uncontrolled pain   Complete by: As directed    Call MD for:  temperature  >100.4   Complete by: As directed    Diet - low sodium heart healthy   Complete by: As directed    Increase activity slowly   Complete by: As directed    May shower / Bathe   Complete by: As directed    May walk up steps   Complete by: As directed    No dressing needed   Complete by: As directed      Allergies as of 12/13/2019      Reactions   Morphine And Related Itching, Nausea And Vomiting      Medication List    STOP taking these medications   aspirin EC 81 MG tablet Replaced by: aspirin 325 MG tablet     TAKE these medications   aspirin 325 MG tablet Take 1 tablet (325 mg total) by mouth daily. Replaces: aspirin EC 81 MG tablet   buPROPion 150 MG 24 hr tablet Commonly known as: WELLBUTRIN XL Take 150 mg by mouth daily.   clopidogrel 75 MG tablet Commonly known as: Plavix Take 1 tablet (75 mg total) by mouth daily.   omeprazole 40 MG capsule Commonly known as: PRILOSEC Take 40 mg by mouth daily.   sertraline 100 MG tablet Commonly known as: ZOLOFT Take 100 mg by mouth daily.      Follow-up Information    Luanne Bras, MD. Go in 2 week(s).   Specialties: Interventional Radiology, Radiology Why: Clinic will call you to schedule follow up appt Contact information: [place] [address] [phone]  Signed: Ascencion Dike PA-C 12/13/2019, 9:45 AM

## 2019-12-14 ENCOUNTER — Telehealth (HOSPITAL_COMMUNITY): Payer: Self-pay

## 2019-12-14 NOTE — Telephone Encounter (Signed)
Pt called because her left eye was swollen and red. She was worried it may have something to do with her procedure. Sent a message to our PA who discussed with Dr. Estanislado Pandy. Per Deveshwar, not related to surgery or complications. May just be irritation from the tape that was used when she was intubated. If it gets worse, she may need to call PCP to look at. I informed pt and she was relieved. She will call PCP if it gets worse. AW

## 2019-12-20 ENCOUNTER — Encounter (HOSPITAL_COMMUNITY): Payer: Self-pay

## 2019-12-29 ENCOUNTER — Ambulatory Visit (HOSPITAL_COMMUNITY): Payer: BC Managed Care – PPO

## 2020-01-02 ENCOUNTER — Other Ambulatory Visit: Payer: Self-pay

## 2020-01-02 ENCOUNTER — Ambulatory Visit (HOSPITAL_COMMUNITY)
Admission: RE | Admit: 2020-01-02 | Discharge: 2020-01-02 | Disposition: A | Discharge status: Home / Self Care | Payer: Self-pay | Source: Ambulatory Visit | Attending: Radiology | Admitting: Radiology

## 2020-01-02 DIAGNOSIS — I771 Stricture of artery: Secondary | ICD-10-CM

## 2020-01-02 NOTE — Progress Notes (Signed)
Chief Complaint: Patient was seen in consultation today for right TS/SS junction stenosis s/p revascularization.  Referring Physician(s): Fannie Knee  Supervising Physician: Luanne Bras  Patient Status: [facility] - Out-pt  History of Present Illness: Stephanie [name] is a 49 y.o. female with a past medical history as below, with pertinent past medical history including pneumonia, GERD, and anxiety. She is known to [facility] and has been followed by Dr. Estanislado Pandy since 12/2011. She first presented to our department at the request of Dr. Thornell Mule for management of right-sided pulsatile tinnitus. 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 At that time, patient decided to pursue conservative management for her right TS/SS junction stenosis. She has since been followed by routine imaging scans and diagnostic cerebral arteriograms to monitor for changes. After her most recent follow-up MR/MRA brain/head 11/07/2019, patient decided to pursue endovascular revascularization for her stenosis. She then underwent an image-guided cerebral arteriogram with revascularization of right TS/SS junction stenosis using stent assisted angioplasty 12/12/2019 by Dr. Estanislado Pandy. She was discharged home 12/13/2019 in stable condition.  Patient presents today for follow-up regarding her recent procedure 12/12/2019. Patient awake and alert sitting in chair with no complaints at this time. Accompanied by husband. Denies headache, weakness, numbness/tingling, dizziness, vision changes, hearing changes, tinnitus, or speech difficulty.  Currently taking Plavix 75 mg once daily and Aspirin 325 mg once daily.   Past Medical History:  Diagnosis Date  . Anxiety   . GERD (gastroesophageal reflux disease)   . Menorrhagia   . Pneumonia   . Wears contact lenses     Past Surgical History:  Procedure Laterality  Date  . ANTERIOR CERVICAL DECOMP/DISCECTOMY FUSION  06/ 2015   C4 -- C5  . BACK SURGERY    . CESAREAN SECTION  03-29-2002/  1997/  1994  . CHOLECYSTECTOMY  08/12/2012   Procedure: LAPAROSCOPIC CHOLECYSTECTOMY WITH INTRAOPERATIVE CHOLANGIOGRAM;  Surgeon: Earnstine Regal, MD;  Location: [facility];  Service: General;  Laterality: N/A;  . DILITATION & CURRETTAGE/HYSTROSCOPY WITH NOVASURE ABLATION N/A 10/26/2014   Procedure: DILATATION & CURETTAGE/HYSTEROSCOPY WITH ATTEMPTED NOVASURE ABLATION WITH IUD REMOVAL;  Surgeon: Maisie Fus, MD;  Location: [place];  Service: Gynecology;  Laterality: N/A;  . INTRAUTERINE DEVICE (IUD) INSERTION  2010  . IR ANGIO INTRA EXTRACRAN SEL COM CAROTID INNOMINATE UNI R MOD SED  12/12/2019  . IR CT HEAD LTD  12/12/2019  . IR RADIOLOGIST EVAL & MGMT  06/22/2018  . IR TRANSCATH PLC STENT 1ST ART NOT LE CV CAR VERT CAR  12/12/2019  . IR VENO/JUGULAR RIGHT  12/12/2019  . LAPAROSCOPIC TUBAL LIGATION Bilateral 10/26/2014   Procedure: LAPAROSCOPIC TUBAL LIGATION;  Surgeon: Maisie Fus, MD;  Location: [facility];  Service: Gynecology;  Laterality: Bilateral;  . LUMBAR LAMINECTOMY/DECOMPRESSION MICRODISCECTOMY  02/10/2012   Procedure: LUMBAR LAMINECTOMY/DECOMPRESSION MICRODISCECTOMY 1 LEVEL;  Surgeon: Charlie Pitter, MD;  Location: [place] [facility];  Service: Neurosurgery;  Laterality: Left;  Left Lumbar Four-Five Laminectomy and Microdiskectomy  . RADIOLOGY WITH ANESTHESIA Bilateral 12/12/2019   Procedure: STENTING;  Surgeon: Luanne Bras, MD;  Location: [place];  Service: Radiology;  Laterality: Bilateral;  . REMOVAL BENIGN THROAT CYST  age 58    Allergies: Morphine and related  Medications: Prior to Admission medications   Medication Sig Start Date End Date Taking? Authorizing Provider  aspirin 325 MG tablet Take 1 tablet (325 mg total) by mouth daily.  12/13/19   Ascencion Dike, PA-C  buPROPion (WELLBUTRIN XL) 150 MG 24 hr tablet Take 150 mg  by mouth daily.    [provider]  clopidogrel (PLAVIX) 75 MG tablet Take 1 tablet (75 mg total) by mouth daily. 12/01/19   Ascencion Dike, PA-C  omeprazole (PRILOSEC) 40 MG capsule Take 40 mg by mouth daily.    [provider]  sertraline (ZOLOFT) 100 MG tablet Take 100 mg by mouth daily.    [provider]     Family History  Problem Relation Age of Onset  . Anesthesia problems Mother     Social History   Socioeconomic History  . Marital status: Married    Spouse name: Not on file  . Number of children: Not on file  . Years of education: Not on file  . Highest education level: Not on file  Occupational History  . Occupation: Programme researcher, broadcasting/film/video: [employer]  Tobacco Use  . Smoking status: Never Smoker  . Smokeless tobacco: Never Used  Substance and Sexual Activity  . Alcohol use: No  . Drug use: No  . Sexual activity: Yes    Birth control/protection: I.U.D.  Other Topics Concern  . Not on file  Social History Narrative  . Not on file   Social Determinants of Health   Financial Resource Strain:   . Difficulty of Paying Living Expenses: Not on file  Food Insecurity:   . Worried About Charity fundraiser in the Last Year: Not on file  . Ran Out of Food in the Last Year: Not on file  Transportation Needs:   . Lack of Transportation (Medical): Not on file  . Lack of Transportation (Non-Medical): Not on file  Physical Activity:   . Days of Exercise per Week: Not on file  . Minutes of Exercise per Session: Not on file  Stress:   . Feeling of Stress : Not on file  Social Connections:   . Frequency of Communication with Friends and Family: Not on file  . Frequency of Social Gatherings with Friends and Family: Not on file  . Attends Religious Services: Not on file  . Active Member of Clubs or Organizations: Not on file  . Attends Archivist Meetings: Not on file  . Marital Status: Not on file     Review of Systems: A 12  point ROS discussed and pertinent positives are indicated in the HPI above.  All other systems are negative.  Review of Systems  Constitutional: Negative for chills and fever.  HENT: Negative for hearing loss and tinnitus.   Eyes: Negative for visual disturbance.  Respiratory: Negative for shortness of breath and wheezing.   Cardiovascular: Negative for chest pain and palpitations.  Neurological: Negative for dizziness, speech difficulty, weakness, numbness and headaches.  Psychiatric/Behavioral: Negative for behavioral problems and confusion.    Vital Signs: LMP 07/12/2012   Physical Exam Constitutional:      General: She is not in acute distress.    Appearance: Normal appearance.  Pulmonary:     Effort: Pulmonary effort is normal. No respiratory distress.  Skin:    General: Skin is warm and dry.  Neurological:     Mental Status: She is alert and oriented to person, place, and time.  Psychiatric:        Mood and Affect: Mood normal.        Behavior: Behavior normal.      Imaging: IR Veno/Jugular Right  Result Date:  12/15/2019 CLINICAL DATA:  Progressively worsening right-sided headaches associated with right-sided pulsatile tinnitus. 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 EXAM: IR TRANSCATH PLC STENT 1ST ART NOT LE CV CAR VERT CAR; IR ANGIO VERTEBRAL SEL SUBCLAVIAN INNOMINATE UNI RIGHT MOD SED; IR CT HEAD LIMITED; IR ANGIO INTRA EXTRACRAN SEL COM CAROTID INNOMINATE UNI RIGHT MOD SED; RIGHT JUGULAR VENOGRAPHY COMPARISON:  Previous diagnostic catheter arteriogram and MRIs and MRVs of the brain. MEDICATIONS: Heparin 5,000 units IV. Ancef 2 g IV antibiotic was administered within 1 hour of the procedure. ANESTHESIA/SEDATION: General anesthesia. CONTRAST:  Isovue 300 approximately 70 mL. FLUOROSCOPY TIME:  Fluoroscopy Time: 41 minutes 6 seconds (1979 mGy). COMPLICATIONS: None immediate. TECHNIQUE:  Informed written consent was obtained from the patient after a thorough discussion of the procedural risks, benefits and alternatives. All questions were addressed. Maximal Sterile Barrier Technique was utilized including caps, mask, sterile gowns, sterile gloves, sterile drape, hand hygiene and skin antiseptic. A timeout was performed prior to the initiation of the procedure. Both groins were prepped and draped in the usual sterile fashion. On the right transvenous access into the right common femoral artery was obtained. Using micropuncture set over a 0.035 inch Roadrunner guidewire, a 5 French Pinnacle sheath was then inserted. This was then connected to continuous heparinized saline infusion. A diagnostic JB 1 catheter was then advanced to the right internal jugular vein below the skull base and exchanged over a 0.035 inch 300 cm guidewire for an 8 French 80 cm Neuron Max sheath. This was then connected to continuous heparinized saline infusion. On the left, transarterial access into the left common femoral artery was obtained with a micropuncture set. Over a 0.035 inch Roadrunner guidewire, a 5 [place] Pinnacle sheath was then inserted. Through this, and also over a 0.035 inch guidewire, access was obtained into the right common carotid artery. The guidewire was removed. A control arteriogram was then performed centered extra cranially and intracranially. A 5 French diagnostic catheter was left connected to continuous heparinized saline infusion. Modified Seldinger technique, transfemoral access into the right common femoral artery was obtained without difficulty. Over a 0.035 inch guidewire, a 5 French Pinnacle sheath was inserted. Through this, and also over 0.035 inch guidewire, a 5 [place] JB 1 catheter was advanced to the aortic arch region and selectively positioned in the right common carotid artery, the right external carotid artery, the right vertebral artery, the left common carotid artery and the left  vertebral artery. FINDINGS: The right common carotid arteriogram demonstrates the right external carotid artery and its major branches to be widely patent. The right internal carotid artery at the bulb to the cranial skull base demonstrates wide patency as well. The petrous, the cavernous and the supraclinoid segments are widely patent. The right middle cerebral artery and the right anterior cerebral artery opacify into the capillary and venous phases. The venous phase again demonstrates the dominant right transverse sigmoid sinus junction severe stenosis. ENDOVASCULAR TREATMENT OF SEVERE STENOSIS OF THE DOMINANT RIGHT TRANSVERSE SINUS SIGMOID SINUS JUNCTION Over a 0.035 inch Roadrunner guidewire, an 071 95 cm Benchmark catheter was advanced through the Neuron Max sheath and into the right sigmoid sinus and subsequently the right transverse sinus superior sagittal sinus junction. The guidewire was removed. Good aspiration was obtained from the hub of the Benchmark catheter. An exchange 300 cm 014 inch BMW micro guidewire was then advanced through the Benchmark into the superior sagittal sinus at  the frontal parietal junction. An 8 mm x 40 mm sterling balloon angioplasty catheter was then prepped and purged with 75% contrast and 25% heparinized saline infusion. This was then advanced over the exchange guidewire and positioned with the distal and proximal markers adequate distance from the site of severe stenosis of the right transverse sinus sigmoid sinus junction. A control inflation was then performed with gradual increments in the atmospheric pressure to 1213 atmospheres where it was maintained for approximately 90 seconds. The balloon was then deflated and retrieved proximally. A control arteriogram performed through the right common carotid artery into the venous phase demonstrated significantly improved caliber of the angioplastied segment of the right transverse sinus sigmoid sinus junction. Angioplasty  balloon was removed. Over the [facility] exchange micro guidewire, a Precise 8 mm x 40 mm stent delivery system was then advanced without difficulty and positioned with distal and proximal markers adequate distance from the site of the angioplastied segment. With the usual technique, the stent was then deployed without difficulty. The delivery system was then removed. Control arteriograms performed immediately, and also at 15 and 35 minutes post stent deployment demonstrated excellent flow through the now significantly improved caliber and flow at the site of the previous transverse sinus sigmoid sinus stenosis. The vein of Labbe and also the posterior temporal veins remained patent at this time. No evidence of spasms, dissections or of intraluminal filling defects or of mass-effect was noted. Hemodynamically the patient remained stable. Following removal of the Benchmark guide catheter, a final control arteriogram performed through the right common carotid artery into the venous phase continued to demonstrate excellent flow through the angioplastied segment and free flow into the right internal jugular vein. The exchange guidewire was then removed. The 8 [place] Neuron Max sheath was then removed with hemostasis achieved at the skin entry site. The right common carotid arteriogram performed prior to this continued to demonstrate no change in the extracranial and intracranial right internal carotid artery and the right middle and the right anterior cerebral artery distributions. Throughout the procedure, the patient's ACT was maintained in the region of 200 seconds. The diagnostic JB 1 catheter was then removed, and hemostasis achieved in the groin puncture site with an Angio-Seal closure device. Distal pulses remained Dopplerable in the dorsalis pedis, and the posterior tibial regions bilaterally unchanged from prior to the procedure. A flat panel CT of the brain revealed no evidence of hemorrhage, mass effect or midline  shift. The patient's general anesthesia was then reversed and the patient was extubated without difficulty. Upon recovery, the patient denied any worsening headaches, nausea, vomiting, visual, motor, sensory or coordination difficulties. She was then transferred to the neuro ICU for overnight observations of her blood pressure, neurological status, and to continue on low-dose IV heparin. Overnight, her stay was uneventful. The following morning, the IV heparin was stopped and the patient was switched to Plavix 75 mg a day, and aspirin 325 mg a day. Neurologically she remained intact with minimal right-sided headaches of 3/10. She also reported absence of right-sided pulsatile tinnitus. Both groins were soft.  Distal pulses remained present. The patient was then discharged home under the care of her husband. The patient was advised to maintain adequate hydration and continue on antiplatelets. She was advised to refrain from stooping, bending or lifting weights above 10 pounds for a couple of weeks. She was also advised to not drive for a couple of weeks though could ride in a car. The patient expressed understanding and agreement with  the above management plan. 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 PLAN: Follow-up in the clinic in 2 weeks time. Obtain MRI of the brain and MRV of the brain with contrast in approximately 3-4 months. Electronically Signed   By: Luanne Bras M.D.   On: 12/14/2019 14:44   IR CT Head Ltd  Result Date: 12/15/2019 CLINICAL DATA:  Progressively worsening right-sided headaches associated with right-sided pulsatile tinnitus. 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 EXAM: IR TRANSCATH PLC STENT 1ST ART NOT LE CV CAR VERT CAR; IR ANGIO VERTEBRAL SEL SUBCLAVIAN INNOMINATE UNI RIGHT MOD  SED; IR CT HEAD LIMITED; IR ANGIO INTRA EXTRACRAN SEL COM CAROTID INNOMINATE UNI RIGHT MOD SED; RIGHT JUGULAR VENOGRAPHY COMPARISON:  Previous diagnostic catheter arteriogram and MRIs and MRVs of the brain. MEDICATIONS: Heparin 5,000 units IV. Ancef 2 g IV antibiotic was administered within 1 hour of the procedure. ANESTHESIA/SEDATION: General anesthesia. CONTRAST:  Isovue 300 approximately 70 mL. FLUOROSCOPY TIME:  Fluoroscopy Time: 41 minutes 6 seconds (1979 mGy). COMPLICATIONS: None immediate. TECHNIQUE: Informed written consent was obtained from the patient after a thorough discussion of the procedural risks, benefits and alternatives. All questions were addressed. Maximal Sterile Barrier Technique was utilized including caps, mask, sterile gowns, sterile gloves, sterile drape, hand hygiene and skin antiseptic. A timeout was performed prior to the initiation of the procedure. Both groins were prepped and draped in the usual sterile fashion. On the right transvenous access into the right common femoral artery was obtained. Using micropuncture set over a 0.035 inch Roadrunner guidewire, a 5 French Pinnacle sheath was then inserted. This was then connected to continuous heparinized saline infusion. A diagnostic JB 1 catheter was then advanced to the right internal jugular vein below the skull base and exchanged over a 0.035 inch 300 cm guidewire for an 8 French 80 cm Neuron Max sheath. This was then connected to continuous heparinized saline infusion. On the left, transarterial access into the left common femoral artery was obtained with a micropuncture set. Over a 0.035 inch Roadrunner guidewire, a 5 [place] Pinnacle sheath was then inserted. Through this, and also over a 0.035 inch guidewire, access was obtained into the right common carotid artery. The guidewire was removed. A control arteriogram was then performed centered extra cranially and intracranially. A 5 French diagnostic catheter was left connected to  continuous heparinized saline infusion. Modified Seldinger technique, transfemoral access into the right common femoral artery was obtained without difficulty. Over a 0.035 inch guidewire, a 5 French Pinnacle sheath was inserted. Through this, and also over 0.035 inch guidewire, a 5 [place] JB 1 catheter was advanced to the aortic arch region and selectively positioned in the right common carotid artery, the right external carotid artery, the right vertebral artery, the left common carotid artery and the left vertebral artery. FINDINGS: The right common carotid arteriogram demonstrates the right external carotid artery and its major branches to be widely patent. The right internal carotid artery at the bulb to the cranial skull base demonstrates wide patency as well. The petrous, the cavernous and the supraclinoid segments are widely patent. The right middle cerebral artery and the right anterior cerebral artery opacify into the capillary and venous phases. The venous phase again demonstrates the dominant right transverse sigmoid sinus junction severe stenosis. ENDOVASCULAR TREATMENT OF SEVERE STENOSIS OF THE DOMINANT RIGHT TRANSVERSE SINUS SIGMOID  SINUS JUNCTION Over a 0.035 inch Roadrunner guidewire, an 071 95 cm Benchmark catheter was advanced through the Neuron Max sheath and into the right sigmoid sinus and subsequently the right transverse sinus superior sagittal sinus junction. The guidewire was removed. Good aspiration was obtained from the hub of the Benchmark catheter. An exchange 300 cm 014 inch BMW micro guidewire was then advanced through the Benchmark into the superior sagittal sinus at the frontal parietal junction. An 8 mm x 40 mm sterling balloon angioplasty catheter was then prepped and purged with 75% contrast and 25% heparinized saline infusion. This was then advanced over the exchange guidewire and positioned with the distal and proximal markers adequate distance from the site of severe stenosis  of the right transverse sinus sigmoid sinus junction. A control inflation was then performed with gradual increments in the atmospheric pressure to 1213 atmospheres where it was maintained for approximately 90 seconds. The balloon was then deflated and retrieved proximally. A control arteriogram performed through the right common carotid artery into the venous phase demonstrated significantly improved caliber of the angioplastied segment of the right transverse sinus sigmoid sinus junction. Angioplasty balloon was removed. Over the [facility] exchange micro guidewire, a Precise 8 mm x 40 mm stent delivery system was then advanced without difficulty and positioned with distal and proximal markers adequate distance from the site of the angioplastied segment. With the usual technique, the stent was then deployed without difficulty. The delivery system was then removed. Control arteriograms performed immediately, and also at 15 and 35 minutes post stent deployment demonstrated excellent flow through the now significantly improved caliber and flow at the site of the previous transverse sinus sigmoid sinus stenosis. The vein of Labbe and also the posterior temporal veins remained patent at this time. No evidence of spasms, dissections or of intraluminal filling defects or of mass-effect was noted. Hemodynamically the patient remained stable. Following removal of the Benchmark guide catheter, a final control arteriogram performed through the right common carotid artery into the venous phase continued to demonstrate excellent flow through the angioplastied segment and free flow into the right internal jugular vein. The exchange guidewire was then removed. The 8 [place] Neuron Max sheath was then removed with hemostasis achieved at the skin entry site. The right common carotid arteriogram performed prior to this continued to demonstrate no change in the extracranial and intracranial right internal carotid artery and the right middle  and the right anterior cerebral artery distributions. Throughout the procedure, the patient's ACT was maintained in the region of 200 seconds. The diagnostic JB 1 catheter was then removed, and hemostasis achieved in the groin puncture site with an Angio-Seal closure device. Distal pulses remained Dopplerable in the dorsalis pedis, and the posterior tibial regions bilaterally unchanged from prior to the procedure. A flat panel CT of the brain revealed no evidence of hemorrhage, mass effect or midline shift. The patient's general anesthesia was then reversed and the patient was extubated without difficulty. Upon recovery, the patient denied any worsening headaches, nausea, vomiting, visual, motor, sensory or coordination difficulties. She was then transferred to the neuro ICU for overnight observations of her blood pressure, neurological status, and to continue on low-dose IV heparin. Overnight, her stay was uneventful. The following morning, the IV heparin was stopped and the patient was switched to Plavix 75 mg a day, and aspirin 325 mg a day. Neurologically she remained intact with minimal right-sided headaches of 3/10. She also reported absence of right-sided pulsatile tinnitus. Both groins were soft.  Distal pulses remained present. The patient was then discharged home under the care of her husband. The patient was advised to maintain adequate hydration and continue on antiplatelets. She was advised to refrain from stooping, bending or lifting weights above 10 pounds for a couple of weeks. She was also advised to not drive for a couple of weeks though could ride in a car. The patient expressed understanding and agreement with the above management plan. 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 PLAN: Follow-up in the clinic in 2 weeks time. Obtain MRI of the brain and MRV of the brain with  contrast in approximately 3-4 months. Electronically Signed   By: Luanne Bras M.D.   On: 12/14/2019 14:44   IR TRANSCATH PLC STENT 1ST ART NOT LE CV CAR VERT CAR  Result Date: 12/15/2019 CLINICAL DATA:  Progressively worsening right-sided headaches associated with right-sided pulsatile tinnitus. 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 EXAM: IR TRANSCATH PLC STENT 1ST ART NOT LE CV CAR VERT CAR; IR ANGIO VERTEBRAL SEL SUBCLAVIAN INNOMINATE UNI RIGHT MOD SED; IR CT HEAD LIMITED; IR ANGIO INTRA EXTRACRAN SEL COM CAROTID INNOMINATE UNI RIGHT MOD SED; RIGHT JUGULAR VENOGRAPHY COMPARISON:  Previous diagnostic catheter arteriogram and MRIs and MRVs of the brain. MEDICATIONS: Heparin 5,000 units IV. Ancef 2 g IV antibiotic was administered within 1 hour of the procedure. ANESTHESIA/SEDATION: General anesthesia. CONTRAST:  Isovue 300 approximately 70 mL. FLUOROSCOPY TIME:  Fluoroscopy Time: 41 minutes 6 seconds (1979 mGy). COMPLICATIONS: None immediate. TECHNIQUE: Informed written consent was obtained from the patient after a thorough discussion of the procedural risks, benefits and alternatives. All questions were addressed. Maximal Sterile Barrier Technique was utilized including caps, mask, sterile gowns, sterile gloves, sterile drape, hand hygiene and skin antiseptic. A timeout was performed prior to the initiation of the procedure. Both groins were prepped and draped in the usual sterile fashion. On the right transvenous access into the right common femoral artery was obtained. Using micropuncture set over a 0.035 inch Roadrunner guidewire, a 5 French Pinnacle sheath was then inserted. This was then connected to continuous heparinized saline infusion. A diagnostic JB 1 catheter was then advanced to the right internal jugular vein below the skull base and exchanged over a 0.035 inch 300 cm guidewire for an 8 French 80 cm Neuron  Max sheath. This was then connected to continuous heparinized saline infusion. On the left, transarterial access into the left common femoral artery was obtained with a micropuncture set. Over a 0.035 inch Roadrunner guidewire, a 5 [place] Pinnacle sheath was then inserted. Through this, and also over a 0.035 inch guidewire, access was obtained into the right common carotid artery. The guidewire was removed. A control arteriogram was then performed centered extra cranially and intracranially. A 5 French diagnostic catheter was left connected to continuous heparinized saline infusion. Modified Seldinger technique, transfemoral access into the right common femoral artery was obtained without difficulty. Over a 0.035 inch guidewire, a 5 French Pinnacle sheath was inserted. Through this, and also over 0.035 inch guidewire, a 5 [place] JB 1 catheter was advanced to the aortic arch region and selectively positioned in the right common carotid artery, the right external carotid artery, the right vertebral artery, the left common carotid artery and the left vertebral artery. FINDINGS: The right common carotid arteriogram demonstrates the right external carotid artery and  its major branches to be widely patent. The right internal carotid artery at the bulb to the cranial skull base demonstrates wide patency as well. The petrous, the cavernous and the supraclinoid segments are widely patent. The right middle cerebral artery and the right anterior cerebral artery opacify into the capillary and venous phases. The venous phase again demonstrates the dominant right transverse sigmoid sinus junction severe stenosis. ENDOVASCULAR TREATMENT OF SEVERE STENOSIS OF THE DOMINANT RIGHT TRANSVERSE SINUS SIGMOID SINUS JUNCTION Over a 0.035 inch Roadrunner guidewire, an 071 95 cm Benchmark catheter was advanced through the Neuron Max sheath and into the right sigmoid sinus and subsequently the right transverse sinus superior sagittal sinus  junction. The guidewire was removed. Good aspiration was obtained from the hub of the Benchmark catheter. An exchange 300 cm 014 inch BMW micro guidewire was then advanced through the Benchmark into the superior sagittal sinus at the frontal parietal junction. An 8 mm x 40 mm sterling balloon angioplasty catheter was then prepped and purged with 75% contrast and 25% heparinized saline infusion. This was then advanced over the exchange guidewire and positioned with the distal and proximal markers adequate distance from the site of severe stenosis of the right transverse sinus sigmoid sinus junction. A control inflation was then performed with gradual increments in the atmospheric pressure to 1213 atmospheres where it was maintained for approximately 90 seconds. The balloon was then deflated and retrieved proximally. A control arteriogram performed through the right common carotid artery into the venous phase demonstrated significantly improved caliber of the angioplastied segment of the right transverse sinus sigmoid sinus junction. Angioplasty balloon was removed. Over the [facility] exchange micro guidewire, a Precise 8 mm x 40 mm stent delivery system was then advanced without difficulty and positioned with distal and proximal markers adequate distance from the site of the angioplastied segment. With the usual technique, the stent was then deployed without difficulty. The delivery system was then removed. Control arteriograms performed immediately, and also at 15 and 35 minutes post stent deployment demonstrated excellent flow through the now significantly improved caliber and flow at the site of the previous transverse sinus sigmoid sinus stenosis. The vein of Labbe and also the posterior temporal veins remained patent at this time. No evidence of spasms, dissections or of intraluminal filling defects or of mass-effect was noted. Hemodynamically the patient remained stable. Following removal of the Benchmark guide  catheter, a final control arteriogram performed through the right common carotid artery into the venous phase continued to demonstrate excellent flow through the angioplastied segment and free flow into the right internal jugular vein. The exchange guidewire was then removed. The 8 [place] Neuron Max sheath was then removed with hemostasis achieved at the skin entry site. The right common carotid arteriogram performed prior to this continued to demonstrate no change in the extracranial and intracranial right internal carotid artery and the right middle and the right anterior cerebral artery distributions. Throughout the procedure, the patient's ACT was maintained in the region of 200 seconds. The diagnostic JB 1 catheter was then removed, and hemostasis achieved in the groin puncture site with an Angio-Seal closure device. Distal pulses remained Dopplerable in the dorsalis pedis, and the posterior tibial regions bilaterally unchanged from prior to the procedure. A flat panel CT of the brain revealed no evidence of hemorrhage, mass effect or midline shift. The patient's general anesthesia was then reversed and the patient was extubated without difficulty. Upon recovery, the patient denied any worsening headaches, nausea, vomiting, visual, motor,  sensory or coordination difficulties. She was then transferred to the neuro ICU for overnight observations of her blood pressure, neurological status, and to continue on low-dose IV heparin. Overnight, her stay was uneventful. The following morning, the IV heparin was stopped and the patient was switched to Plavix 75 mg a day, and aspirin 325 mg a day. Neurologically she remained intact with minimal right-sided headaches of 3/10. She also reported absence of right-sided pulsatile tinnitus. Both groins were soft.  Distal pulses remained present. The patient was then discharged home under the care of her husband. The patient was advised to maintain adequate hydration and  continue on antiplatelets. She was advised to refrain from stooping, bending or lifting weights above 10 pounds for a couple of weeks. She was also advised to not drive for a couple of weeks though could ride in a car. The patient expressed understanding and agreement with the above management plan. 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 PLAN: Follow-up in the clinic in 2 weeks time. Obtain MRI of the brain and MRV of the brain with contrast in approximately 3-4 months. Electronically Signed   By: Luanne Bras M.D.   On: 12/14/2019 14:44   IR ANGIO INTRA EXTRACRAN SEL COM CAROTID INNOMINATE UNI R MOD SED  Result Date: 12/15/2019 CLINICAL DATA:  Progressively worsening right-sided headaches associated with right-sided pulsatile tinnitus. 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 EXAM: IR TRANSCATH PLC STENT 1ST ART NOT LE CV CAR VERT CAR; IR ANGIO VERTEBRAL SEL SUBCLAVIAN INNOMINATE UNI RIGHT MOD SED; IR CT HEAD LIMITED; IR ANGIO INTRA EXTRACRAN SEL COM CAROTID INNOMINATE UNI RIGHT MOD SED; RIGHT JUGULAR VENOGRAPHY COMPARISON:  Previous diagnostic catheter arteriogram and MRIs and MRVs of the brain. MEDICATIONS: Heparin 5,000 units IV. Ancef 2 g IV antibiotic was administered within 1 hour of the procedure. ANESTHESIA/SEDATION: General anesthesia. CONTRAST:  Isovue 300 approximately 70 mL. FLUOROSCOPY TIME:  Fluoroscopy Time: 41 minutes 6 seconds (1979 mGy). COMPLICATIONS: None immediate. TECHNIQUE: Informed written consent was obtained from the patient after a thorough discussion of the procedural risks, benefits and alternatives. All questions were addressed. Maximal Sterile Barrier Technique was utilized including caps, mask, sterile gowns, sterile gloves, sterile drape, hand hygiene and skin antiseptic.  A timeout was performed prior to the initiation of the procedure. Both groins were prepped and draped in the usual sterile fashion. On the right transvenous access into the right common femoral artery was obtained. Using micropuncture set over a 0.035 inch Roadrunner guidewire, a 5 French Pinnacle sheath was then inserted. This was then connected to continuous heparinized saline infusion. A diagnostic JB 1 catheter was then advanced to the right internal jugular vein below the skull base and exchanged over a 0.035 inch 300 cm guidewire for an 8 French 80 cm Neuron Max sheath. This was then connected to continuous heparinized saline infusion. On the left, transarterial access into the left common femoral artery was obtained with a micropuncture set. Over a 0.035 inch Roadrunner guidewire, a 5 [place] Pinnacle sheath was then inserted. Through this, and also over a 0.035 inch guidewire, access was obtained into the right common carotid artery. The guidewire was removed. A control arteriogram was then performed centered extra cranially and intracranially. A 5 French diagnostic catheter was left connected to continuous heparinized saline infusion. Modified Seldinger technique, transfemoral access into the right  common femoral artery was obtained without difficulty. Over a 0.035 inch guidewire, a 5 French Pinnacle sheath was inserted. Through this, and also over 0.035 inch guidewire, a 5 [place] JB 1 catheter was advanced to the aortic arch region and selectively positioned in the right common carotid artery, the right external carotid artery, the right vertebral artery, the left common carotid artery and the left vertebral artery. FINDINGS: The right common carotid arteriogram demonstrates the right external carotid artery and its major branches to be widely patent. The right internal carotid artery at the bulb to the cranial skull base demonstrates wide patency as well. The petrous, the cavernous and the supraclinoid  segments are widely patent. The right middle cerebral artery and the right anterior cerebral artery opacify into the capillary and venous phases. The venous phase again demonstrates the dominant right transverse sigmoid sinus junction severe stenosis. ENDOVASCULAR TREATMENT OF SEVERE STENOSIS OF THE [place] RIGHT TRANSVERSE SINUS SIGMOID SINUS JUNCTION Over a 0.035 inch Roadrunner guidewire, an 071 95 cm Benchmark catheter was advanced through the Neuron Max sheath and into the right sigmoid sinus and subsequently the right transverse sinus superior sagittal sinus junction. The guidewire was removed. Good aspiration was obtained from the hub of the Benchmark catheter. An exchange 300 cm 014 inch BMW micro guidewire was then advanced through the Benchmark into the superior sagittal sinus at the frontal parietal junction. An 8 mm x 40 mm sterling balloon angioplasty catheter was then prepped and purged with 75% contrast and 25% heparinized saline infusion. This was then advanced over the exchange guidewire and positioned with the distal and proximal markers adequate distance from the site of severe stenosis of the right transverse sinus sigmoid sinus junction. A control inflation was then performed with gradual increments in the atmospheric pressure to 1213 atmospheres where it was maintained for approximately 90 seconds. The balloon was then deflated and retrieved proximally. A control arteriogram performed through the right common carotid artery into the venous phase demonstrated significantly improved caliber of the angioplastied segment of the right transverse sinus sigmoid sinus junction. Angioplasty balloon was removed. Over the [facility] exchange micro guidewire, a Precise 8 mm x 40 mm stent delivery system was then advanced without difficulty and positioned with distal and proximal markers adequate distance from the site of the angioplastied segment. With the usual technique, the stent was then deployed without  difficulty. The delivery system was then removed. Control arteriograms performed immediately, and also at 15 and 35 minutes post stent deployment demonstrated excellent flow through the now significantly improved caliber and flow at the site of the previous transverse sinus sigmoid sinus stenosis. The vein of Labbe and also the posterior temporal veins remained patent at this time. No evidence of spasms, dissections or of intraluminal filling defects or of mass-effect was noted. Hemodynamically the patient remained stable. Following removal of the Benchmark guide catheter, a final control arteriogram performed through the right common carotid artery into the venous phase continued to demonstrate excellent flow through the angioplastied segment and free flow into the right internal jugular vein. The exchange guidewire was then removed. The 8 [place] Neuron Max sheath was then removed with hemostasis achieved at the skin entry site. The right common carotid arteriogram performed prior to this continued to demonstrate no change in the extracranial and intracranial right internal carotid artery and the right middle and the right anterior cerebral artery distributions. Throughout the procedure, the patient's ACT was maintained in the region of 200 seconds. The diagnostic JB  1 catheter was then removed, and hemostasis achieved in the groin puncture site with an Angio-Seal closure device. Distal pulses remained Dopplerable in the dorsalis pedis, and the posterior tibial regions bilaterally unchanged from prior to the procedure. A flat panel CT of the brain revealed no evidence of hemorrhage, mass effect or midline shift. The patient's general anesthesia was then reversed and the patient was extubated without difficulty. Upon recovery, the patient denied any worsening headaches, nausea, vomiting, visual, motor, sensory or coordination difficulties. She was then transferred to the neuro ICU for overnight observations of her  blood pressure, neurological status, and to continue on low-dose IV heparin. Overnight, her stay was uneventful. The following morning, the IV heparin was stopped and the patient was switched to Plavix 75 mg a day, and aspirin 325 mg a day. Neurologically she remained intact with minimal right-sided headaches of 3/10. She also reported absence of right-sided pulsatile tinnitus. Both groins were soft.  Distal pulses remained present. The patient was then discharged home under the care of her husband. The patient was advised to maintain adequate hydration and continue on antiplatelets. She was advised to refrain from stooping, bending or lifting weights above 10 pounds for a couple of weeks. She was also advised to not drive for a couple of weeks though could ride in a car. The patient expressed understanding and agreement with the above management plan. 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 PLAN: Follow-up in the clinic in 2 weeks time. Obtain MRI of the brain and MRV of the brain with contrast in approximately 3-4 months. Electronically Signed   By: Luanne Bras M.D.   On: 12/14/2019 14:44    Labs:  CBC: Recent Labs    12/12/19 0633 12/13/19 0259  WBC 7.8 10.5  HGB 13.0 11.9*  HCT 41.8 37.4  PLT 394 374    COAGS: Recent Labs    12/12/19 0633  INR 1.0  APTT 24    BMP: Recent Labs    12/12/19 0633 12/13/19 0259  NA 140 139  K 3.5 3.5  CL 107 108  CO2 23 22  GLUCOSE 95 132*  BUN 12 7  CALCIUM 8.7* 8.4*  CREATININE 0.73 0.57  GFRNONAA >60 >60  GFRAA >60 >60     Assessment and Plan:  Right TS/SS junction stenosis s/p revascularization using stent assisted angioplasty 12/12/2019 by Dr. Estanislado Pandy. Dr. Estanislado Pandy was present for consultation. Discussed symptoms post-procedure. Patient denies headache, dizziness, or tinnitus. Discussed right TS/SS  junction stent. Explained the best course of management for her stent at this time is with routine imaging scans to monitor for changes. Advised patient to avoid all estrogen hormone therapies from here on out.  Discussed patient's weight. Patient counseled on weight loss management today.  Plan for follow-up with MRI brain (with and without contrast)/MRV head (without contrast) 4-6 months from procedure 12/12/2019. Informed patient that our schedulers will call her to set up this imaging scan. Instructed patient to continue taking Plavix 75 mg once daily and Aspirin 325 mg once daily. Advised patient to stay hydrated by drinking plenty of water.  All questions answered and concerns addressed. Patient and husband convey understanding and agree with plan.  Thank you for this interesting consult.  I greatly enjoyed meeting Stephanie [name] and look forward to participating in their care.  A copy of this report was sent to the requesting provider on this date.  Electronically Signed: Earley Abide, PA-C 01/02/2020, 8:48 AM   I spent a total of 40 Minutes in face to face in clinical consultation, greater than 50% of which was counseling/coordinating care for right TS/SS junction stenosis s/p revascularization.

## 2020-01-04 DIAGNOSIS — F419 Anxiety disorder, unspecified: Secondary | ICD-10-CM | POA: Diagnosis not present

## 2020-01-04 DIAGNOSIS — J0100 Acute maxillary sinusitis, unspecified: Secondary | ICD-10-CM | POA: Diagnosis not present

## 2020-01-10 DIAGNOSIS — R0981 Nasal congestion: Secondary | ICD-10-CM | POA: Diagnosis not present

## 2020-01-12 ENCOUNTER — Emergency Department (HOSPITAL_COMMUNITY): Payer: BC Managed Care – PPO

## 2020-01-12 ENCOUNTER — Emergency Department (HOSPITAL_COMMUNITY)
Admission: EM | Admit: 2020-01-12 | Discharge: 2020-01-12 | Disposition: A | Discharge status: Home / Self Care | Payer: BC Managed Care – PPO | Attending: Emergency Medicine | Admitting: Emergency Medicine

## 2020-01-12 ENCOUNTER — Other Ambulatory Visit: Payer: Self-pay

## 2020-01-12 ENCOUNTER — Encounter (HOSPITAL_COMMUNITY): Payer: Self-pay | Admitting: Emergency Medicine

## 2020-01-12 DIAGNOSIS — U071 COVID-19: Secondary | ICD-10-CM | POA: Insufficient documentation

## 2020-01-12 DIAGNOSIS — R05 Cough: Secondary | ICD-10-CM | POA: Diagnosis not present

## 2020-01-12 DIAGNOSIS — E876 Hypokalemia: Secondary | ICD-10-CM | POA: Diagnosis not present

## 2020-01-12 DIAGNOSIS — J1282 Pneumonia due to coronavirus disease 2019: Secondary | ICD-10-CM | POA: Diagnosis not present

## 2020-01-12 DIAGNOSIS — R0602 Shortness of breath: Secondary | ICD-10-CM | POA: Diagnosis not present

## 2020-01-12 LAB — CBC WITH DIFFERENTIAL/PLATELET
Abs Immature Granulocytes: 0.01 10*3/uL (ref 0.00–0.07)
Basophils Absolute: 0 10*3/uL (ref 0.0–0.1)
Basophils Relative: 0 %
Eosinophils Absolute: 0 10*3/uL (ref 0.0–0.5)
Eosinophils Relative: 0 %
HCT: 41.4 % (ref 36.0–46.0)
Hemoglobin: 13.2 g/dL (ref 12.0–15.0)
Immature Granulocytes: 0 %
Lymphocytes Relative: 19 %
Lymphs Abs: 0.7 10*3/uL (ref 0.7–4.0)
MCH: 28.1 pg (ref 26.0–34.0)
MCHC: 31.9 g/dL (ref 30.0–36.0)
MCV: 88.3 fL (ref 80.0–100.0)
Monocytes Absolute: 0.2 10*3/uL (ref 0.1–1.0)
Monocytes Relative: 6 %
Neutro Abs: 2.8 10*3/uL (ref 1.7–7.7)
Neutrophils Relative %: 75 %
Platelets: 183 10*3/uL (ref 150–400)
RBC: 4.69 MIL/uL (ref 3.87–5.11)
RDW: 13.9 % (ref 11.5–15.5)
WBC: 3.8 10*3/uL — ABNORMAL LOW (ref 4.0–10.5)
nRBC: 0 % (ref 0.0–0.2)

## 2020-01-12 LAB — I-STAT BETA HCG BLOOD, ED (MC, WL, AP ONLY): I-stat hCG, quantitative: <5 m[IU]/mL (ref <5)

## 2020-01-12 LAB — COMPREHENSIVE METABOLIC PANEL
ALT: 67 U/L — ABNORMAL HIGH (ref 0–44)
AST: 50 U/L — ABNORMAL HIGH (ref 15–41)
Albumin: 3.8 g/dL (ref 3.5–5.0)
Alkaline Phosphatase: 59 U/L (ref 38–126)
Anion gap: 9 (ref 5–15)
BUN: 9 mg/dL (ref 6–20)
CO2: 26 mmol/L (ref 22–32)
Calcium: 8.4 mg/dL — ABNORMAL LOW (ref 8.9–10.3)
Chloride: 105 mmol/L (ref 98–111)
Creatinine, Ser: 0.63 mg/dL (ref 0.44–1.00)
GFR calc Af Amer: >60 mL/min (ref >60)
GFR calc non Af Amer: >60 mL/min (ref >60)
Glucose, Bld: 106 mg/dL — ABNORMAL HIGH (ref 70–99)
Potassium: 2.8 mmol/L — ABNORMAL LOW (ref 3.5–5.1)
Sodium: 140 mmol/L (ref 135–145)
Total Bilirubin: 0.3 mg/dL (ref 0.3–1.2)
Total Protein: 7.3 g/dL (ref 6.5–8.1)

## 2020-01-12 LAB — POC SARS CORONAVIRUS 2 AG -  ED: SARS Coronavirus 2 Ag: POSITIVE — AB

## 2020-01-12 LAB — LACTATE DEHYDROGENASE: LDH: 179 U/L (ref 98–192)

## 2020-01-12 LAB — PROCALCITONIN: Procalcitonin: <0.1 ng/mL

## 2020-01-12 LAB — FIBRINOGEN: Fibrinogen: 416 mg/dL (ref 210–475)

## 2020-01-12 LAB — TRIGLYCERIDES: Triglycerides: 71 mg/dL (ref <150)

## 2020-01-12 LAB — D-DIMER, QUANTITATIVE: D-Dimer, Quant: 0.67 ug/mL-FEU — ABNORMAL HIGH (ref 0.00–0.50)

## 2020-01-12 LAB — C-REACTIVE PROTEIN: CRP: 1.4 mg/dL — ABNORMAL HIGH (ref <1.0)

## 2020-01-12 LAB — LACTIC ACID, PLASMA: Lactic Acid, Venous: 0.8 mmol/L (ref 0.5–1.9)

## 2020-01-12 LAB — FERRITIN: Ferritin: 109 ng/mL (ref 11–307)

## 2020-01-12 MED ORDER — POTASSIUM CHLORIDE CRYS ER 20 MEQ PO TBCR
40.0000 meq | EXTENDED_RELEASE_TABLET | Freq: Once | ORAL | Status: AC
  Administered 2020-01-12: 40 meq via ORAL
  Filled 2020-01-12: qty 2

## 2020-01-12 NOTE — ED Provider Notes (Signed)
[facility] DEPT Provider Note   CSN: [id number] Arrival date & time: 01/12/20  [id number]     History Chief Complaint  Patient presents with  . Shortness of Breath    Stephanie [name] is a 49 y.o. female.  She started with body aches and a nonproductive cough shortness of breath headache 6 days ago.  She tested positive for Covid 2 days ago.  She has been using a home pulse ox and she says it has been 89% with activity.  Positive diarrhea.  Fever for 1 day at the beginning of her illness but none recently.  The history is provided by the patient.  Shortness of Breath Severity:  Moderate Onset quality:  Gradual Timing:  Intermittent Progression:  Worsening Chronicity:  New Context: URI   Relieved by:  Nothing Worsened by:  Activity and coughing Ineffective treatments:  Position changes Associated symptoms: cough, fever and headaches   Associated symptoms: no abdominal pain, no chest pain, no hemoptysis, no rash, no sore throat, no sputum production and no vomiting        Past Medical History:  Diagnosis Date  . Anxiety   . GERD (gastroesophageal reflux disease)   . Menorrhagia   . Pneumonia   . Wears contact lenses     Patient Active Problem List   Diagnosis Date Noted  . Pulsatile tinnitus of right ear 12/12/2019  . Herpes zoster without complication [id number]  . Cholelithiasis 08/12/2012  . 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 DIAPHORESIS 06/01/2009  . PHARYNGITIS, STREPTOCOCCAL 05/08/2009  . ABNORMAL WEIGHT GAIN 05/08/2009    Past Surgical History:  Procedure Laterality Date  . ANTERIOR CERVICAL DECOMP/DISCECTOMY FUSION  06/ 2015   C4 -- C5  . BACK SURGERY    . CESAREAN SECTION  03-29-2002/  1997/  1994  . CHOLECYSTECTOMY  08/12/2012   Procedure: LAPAROSCOPIC CHOLECYSTECTOMY WITH INTRAOPERATIVE CHOLANGIOGRAM;  Surgeon: Earnstine Regal, MD;  Location: [facility];  Service: General;  Laterality: N/A;  . DILITATION &  CURRETTAGE/HYSTROSCOPY WITH NOVASURE ABLATION N/A 10/26/2014   Procedure: DILATATION & CURETTAGE/HYSTEROSCOPY WITH ATTEMPTED NOVASURE ABLATION WITH IUD REMOVAL;  Surgeon: Maisie Fus, MD;  Location: [facility];  Service: Gynecology;  Laterality: N/A;  . INTRAUTERINE DEVICE (IUD) INSERTION  2010  . IR ANGIO INTRA EXTRACRAN SEL COM CAROTID INNOMINATE UNI R MOD SED  12/12/2019  . IR CT HEAD LTD  12/12/2019  . IR RADIOLOGIST EVAL & MGMT  06/22/2018  . IR TRANSCATH PLC STENT 1ST ART NOT LE CV CAR VERT CAR  12/12/2019  . IR VENO/JUGULAR RIGHT  12/12/2019  . LAPAROSCOPIC TUBAL LIGATION Bilateral 10/26/2014   Procedure: LAPAROSCOPIC TUBAL LIGATION;  Surgeon: Maisie Fus, MD;  [facility];  Service: Gynecology;  Laterality: Bilateral;  . LUMBAR LAMINECTOMY/DECOMPRESSION MICRODISCECTOMY  02/10/2012   Procedure: LUMBAR LAMINECTOMY/DECOMPRESSION MICRODISCECTOMY 1 LEVEL;  Surgeon: Charlie Pitter, MD;  Location: [facility];  Service: Neurosurgery;  Laterality: Left;  Left Lumbar Four-Five Laminectomy and Microdiskectomy  . RADIOLOGY WITH ANESTHESIA Bilateral 12/12/2019   Procedure: STENTING;  Surgeon: Luanne Bras, MD;  Location: [facility];  Service: Radiology;  Laterality: Bilateral;  . REMOVAL BENIGN THROAT CYST  age 37     OB History   No obstetric history on file.     Family History  Problem Relation Age of Onset  . Anesthesia problems Mother     Social History   Tobacco Use  . Smoking status: Never Smoker  .  Smokeless tobacco: Never Used  Substance Use Topics  . Alcohol use: No  . Drug use: No    Home Medications Prior to Admission medications   Medication Sig Start Date End Date Taking? Authorizing Provider  aspirin 325 MG tablet Take 1 tablet (325 mg total) by mouth daily. 12/13/19   Ascencion Dike, PA-C  buPROPion (WELLBUTRIN XL) 150 MG 24 hr tablet Take 150 mg by mouth daily.    [provider]  clopidogrel (PLAVIX) 75 MG tablet  Take 1 tablet (75 mg total) by mouth daily. 12/01/19   Ascencion Dike, PA-C  omeprazole (PRILOSEC) 40 MG capsule Take 40 mg by mouth daily.    [provider]  sertraline (ZOLOFT) 100 MG tablet Take 100 mg by mouth daily.    [provider]    Allergies    Morphine and related  Review of Systems   Review of Systems  Constitutional: Positive for fever.  HENT: Negative for sore throat.   Eyes: Negative for visual disturbance.  Respiratory: Positive for cough and shortness of breath. Negative for hemoptysis and sputum production.   Cardiovascular: Negative for chest pain.  Gastrointestinal: Positive for diarrhea. Negative for abdominal pain and vomiting.  Genitourinary: Negative for dysuria.  Musculoskeletal: Positive for myalgias.  Skin: Negative for rash.  Neurological: Positive for headaches.    Physical Exam Updated Vital Signs BP 129/80 (BP Location: Right Arm)   Pulse 88   Temp 99.5 F (37.5 C) (Oral)   Resp 18   Ht 5\' 4"  (1.626 m)   Wt 113.4 kg   LMP 07/12/2012   SpO2 97%   BMI 42.91 kg/m   Physical Exam Vitals and nursing note reviewed.  Constitutional:      General: She is not in acute distress.    Appearance: She is well-developed.  HENT:     Head: Normocephalic and atraumatic.  Eyes:     Conjunctiva/sclera: Conjunctivae normal.  Cardiovascular:     Rate and Rhythm: Normal rate and regular rhythm.     Heart sounds: No murmur.  Pulmonary:     Effort: Pulmonary effort is normal. Tachypnea present. No respiratory distress.     Breath sounds: Normal breath sounds.  Abdominal:     Palpations: Abdomen is soft.     Tenderness: There is no abdominal tenderness.  Musculoskeletal:        General: Normal range of motion.     Cervical back: Neck supple.     Right lower leg: No tenderness. No edema.     Left lower leg: No tenderness. No edema.  Skin:    General: Skin is warm and dry.     Capillary Refill: Capillary refill takes less than 2  seconds.  Neurological:     General: No focal deficit present.     Mental Status: She is alert.     ED Results / Procedures / Treatments   Labs (all labs ordered are listed, but only abnormal results are displayed) Labs Reviewed  CBC WITH DIFFERENTIAL/PLATELET - Abnormal; Notable for the following components:      Result Value   WBC 3.8 (*)    All other components within normal limits  COMPREHENSIVE METABOLIC PANEL - Abnormal; Notable for the following components:   Potassium 2.8 (*)    Glucose, Bld 106 (*)    Calcium 8.4 (*)    AST 50 (*)    ALT 67 (*)    All other components within normal limits  D-DIMER, QUANTITATIVE (NOT AT  [facility]) - Abnormal; Notable for the following components:   D-Dimer, Quant 0.67 (*)    All other components within normal limits  C-REACTIVE PROTEIN - Abnormal; Notable for the following components:   CRP 1.4 (*)    All other components within normal limits  POC SARS CORONAVIRUS 2 AG -  ED - Abnormal; Notable for the following components:   SARS Coronavirus 2 Ag POSITIVE (*)    All other components within normal limits  CULTURE, BLOOD (ROUTINE X 2)  CULTURE, BLOOD (ROUTINE X 2)  LACTIC ACID, PLASMA  PROCALCITONIN  LACTATE DEHYDROGENASE  FERRITIN  TRIGLYCERIDES  FIBRINOGEN  I-STAT BETA HCG BLOOD, ED ([facility], [facility], [facility] ONLY)    EKG EKG Interpretation  Date/Time:  Thursday January 12 2020 09:46:21 EST Ventricular Rate:  88 PR Interval:    QRS Duration: 89 QT Interval:  366 QTC Calculation: 443 R Axis:   52 Text Interpretation: Sinus rhythm Low voltage, precordial leads Borderline repolarization abnormality No significant change since 1/13 Confirmed by Aletta Edouard [phone]) on 01/12/2020 9:52:08 AM   Radiology DG Chest Port 1 View  Result Date: 01/12/2020 CLINICAL DATA:  Shortness of breath with decreased oxygen saturation. COVID-19 positive EXAM: PORTABLE CHEST 1 VIEW COMPARISON:  January 19, 2012 FINDINGS: There is atelectatic change in the  right base. Lungs elsewhere are clear. Heart is upper normal in size with pulmonary vascularity normal. No adenopathy. There is postoperative change in the lower cervical region. IMPRESSION: Right base atelectasis. Question early pneumonia in this area. Lungs elsewhere clear. Heart upper normal in size. No adenopathy. Electronically Signed   By: Lowella Grip III M.D.   On: 01/12/2020 10:37    Procedures Procedures (including critical care time)  Medications Ordered in ED Medications  potassium chloride SA (KLOR-CON) CR tablet 40 mEq (40 mEq Oral Given 01/12/20 1058)    ED Course  I have reviewed the triage vital signs and the nursing notes.  Pertinent labs & imaging results that were available during my care of the patient were reviewed by me and considered in my medical decision making (see chart for details).  Clinical Course as of Jan 11 1635  Thu Jan 11, 5713  2291 49 year old known Covid positive here with increased shortness of breath and hypoxia.  Differential includes Covid pneumonia, bacterial pneumonia, metabolic derangement   [MB]  1038 Chest x-ray interpreted by me as likely left base pneumonia.   [MB]  [id number] Chest x-ray being read as atelectasis versus early infiltrate.  Patient's pro calcitonin is less than 0.1 so likely related to Covid.  The nurse said her sats remained in the mid 90s with exertion.   [MB]  1117 Reviewed test results with patient.  Have repleted her potassium that was low at 2.8.  Sats remained in the mid 90s.  I told her to continue to monitor her oxygen saturations and if she is persistently below 90 she will need to return for another evaluation.   [MB]    Clinical Course User Index [MB] Hayden Rasmussen, MD   MDM Rules/Calculators/A&P                     Stephanie [name] was evaluated in Emergency Department on 01/12/2020 for the symptoms described in the history of present illness. She was evaluated in the context of the global COVID-19 pandemic,  which necessitated consideration that the patient might be at risk for infection with the SARS-CoV-2 virus that causes COVID-19. Institutional protocols and algorithms  that pertain to the evaluation of patients at risk for COVID-19 are in a state of rapid change based on information released by regulatory bodies including the CDC and federal and state organizations. These policies and algorithms were followed during the patient's care in the ED.  Final Clinical Impression(s) / ED Diagnoses Final diagnoses:  Pneumonia due to COVID-19 virus  Hypokalemia    Rx / DC Orders ED Discharge Orders    None       Hayden Rasmussen, MD 01/12/20 [phone]

## 2020-01-12 NOTE — ED Notes (Signed)
Patient ambulated with NT around room. O2 saturation 94-95%.

## 2020-01-12 NOTE — Discharge Instructions (Addendum)
You were evaluated in the emergency department for worsening of your Covid symptoms.  You had blood work and a chest x-ray.  The chest x-ray showed a possible early infiltrate.  This is likely due to Covid.  Your oxygen saturation remained 94 to 95%.  Currently there is no reason for you to be admitted to the hospital.  Please monitor your oxygen saturations and continue with Tylenol and fluids and rest.  Return to the emergency department if any worsening shortness of breath or your oxygen saturations below 90%.

## 2020-01-13 ENCOUNTER — Telehealth: Payer: Self-pay | Admitting: Nurse Practitioner

## 2020-01-13 NOTE — Telephone Encounter (Signed)
Called to Discuss with patient about Covid symptoms and the use of bamlanivimab, a monoclonal antibody infusion for those with mild to moderate Covid symptoms and at a high risk of hospitalization.     Pt is qualified for this infusion at the [facility] infusion center due to co-morbid conditions and/or a member of an at-risk group.     Unable to reach pt  

## 2020-01-16 ENCOUNTER — Other Ambulatory Visit: Payer: Self-pay

## 2020-01-16 ENCOUNTER — Emergency Department (HOSPITAL_COMMUNITY): Payer: BC Managed Care – PPO

## 2020-01-16 ENCOUNTER — Inpatient Hospital Stay (HOSPITAL_COMMUNITY)
Admission: EM | Admit: 2020-01-16 | Discharge: 2020-01-20 | DRG: 177 | Disposition: A | Discharge status: Home / Self Care | Payer: BC Managed Care – PPO | Attending: Internal Medicine | Admitting: Internal Medicine

## 2020-01-16 ENCOUNTER — Encounter (HOSPITAL_COMMUNITY): Payer: Self-pay

## 2020-01-16 DIAGNOSIS — F32A Depression, unspecified: Secondary | ICD-10-CM | POA: Diagnosis present

## 2020-01-16 DIAGNOSIS — Z6841 Body Mass Index (BMI) 40.0 and over, adult: Secondary | ICD-10-CM | POA: Diagnosis not present

## 2020-01-16 DIAGNOSIS — Z8701 Personal history of pneumonia (recurrent): Secondary | ICD-10-CM

## 2020-01-16 DIAGNOSIS — U071 COVID-19: Secondary | ICD-10-CM | POA: Diagnosis not present

## 2020-01-16 DIAGNOSIS — J189 Pneumonia, unspecified organism: Secondary | ICD-10-CM | POA: Diagnosis not present

## 2020-01-16 DIAGNOSIS — R197 Diarrhea, unspecified: Secondary | ICD-10-CM | POA: Diagnosis present

## 2020-01-16 DIAGNOSIS — K219 Gastro-esophageal reflux disease without esophagitis: Secondary | ICD-10-CM | POA: Diagnosis present

## 2020-01-16 DIAGNOSIS — R7989 Other specified abnormal findings of blood chemistry: Secondary | ICD-10-CM | POA: Insufficient documentation

## 2020-01-16 DIAGNOSIS — Z981 Arthrodesis status: Secondary | ICD-10-CM

## 2020-01-16 DIAGNOSIS — R0602 Shortness of breath: Secondary | ICD-10-CM

## 2020-01-16 DIAGNOSIS — Z7982 Long term (current) use of aspirin: Secondary | ICD-10-CM | POA: Diagnosis not present

## 2020-01-16 DIAGNOSIS — F419 Anxiety disorder, unspecified: Secondary | ICD-10-CM | POA: Diagnosis present

## 2020-01-16 DIAGNOSIS — E876 Hypokalemia: Secondary | ICD-10-CM | POA: Diagnosis present

## 2020-01-16 DIAGNOSIS — J9600 Acute respiratory failure, unspecified whether with hypoxia or hypercapnia: Secondary | ICD-10-CM

## 2020-01-16 DIAGNOSIS — Z79899 Other long term (current) drug therapy: Secondary | ICD-10-CM | POA: Diagnosis not present

## 2020-01-16 DIAGNOSIS — Z885 Allergy status to narcotic agent status: Secondary | ICD-10-CM | POA: Diagnosis not present

## 2020-01-16 DIAGNOSIS — J1282 Pneumonia due to coronavirus disease 2019: Secondary | ICD-10-CM | POA: Diagnosis present

## 2020-01-16 DIAGNOSIS — Z7902 Long term (current) use of antithrombotics/antiplatelets: Secondary | ICD-10-CM

## 2020-01-16 DIAGNOSIS — F329 Major depressive disorder, single episode, unspecified: Secondary | ICD-10-CM | POA: Diagnosis present

## 2020-01-16 DIAGNOSIS — J9601 Acute respiratory failure with hypoxia: Secondary | ICD-10-CM | POA: Diagnosis not present

## 2020-01-16 DIAGNOSIS — Z8616 Personal history of COVID-19: Secondary | ICD-10-CM | POA: Diagnosis not present

## 2020-01-16 LAB — CBC WITH DIFFERENTIAL/PLATELET
Abs Immature Granulocytes: 0.02 10*3/uL (ref 0.00–0.07)
Basophils Absolute: 0 10*3/uL (ref 0.0–0.1)
Basophils Relative: 0 %
Eosinophils Absolute: 0 10*3/uL (ref 0.0–0.5)
Eosinophils Relative: 1 %
HCT: 41.4 % (ref 36.0–46.0)
Hemoglobin: 13.4 g/dL (ref 12.0–15.0)
Immature Granulocytes: 0 %
Lymphocytes Relative: 21 %
Lymphs Abs: 1.2 10*3/uL (ref 0.7–4.0)
MCH: 28.8 pg (ref 26.0–34.0)
MCHC: 32.4 g/dL (ref 30.0–36.0)
MCV: 88.8 fL (ref 80.0–100.0)
Monocytes Absolute: 0.4 10*3/uL (ref 0.1–1.0)
Monocytes Relative: 7 %
Neutro Abs: 3.9 10*3/uL (ref 1.7–7.7)
Neutrophils Relative %: 71 %
Platelets: 239 10*3/uL (ref 150–400)
RBC: 4.66 MIL/uL (ref 3.87–5.11)
RDW: 14.2 % (ref 11.5–15.5)
WBC: 5.5 10*3/uL (ref 4.0–10.5)
nRBC: 0 % (ref 0.0–0.2)

## 2020-01-16 LAB — COMPREHENSIVE METABOLIC PANEL
ALT: 73 U/L — ABNORMAL HIGH (ref 0–44)
AST: 62 U/L — ABNORMAL HIGH (ref 15–41)
Albumin: 3.8 g/dL (ref 3.5–5.0)
Alkaline Phosphatase: 62 U/L (ref 38–126)
Anion gap: 10 (ref 5–15)
BUN: 11 mg/dL (ref 6–20)
CO2: 27 mmol/L (ref 22–32)
Calcium: 8.4 mg/dL — ABNORMAL LOW (ref 8.9–10.3)
Chloride: 103 mmol/L (ref 98–111)
Creatinine, Ser: 0.65 mg/dL (ref 0.44–1.00)
GFR calc Af Amer: >60 mL/min (ref >60)
GFR calc non Af Amer: >60 mL/min (ref >60)
Glucose, Bld: 94 mg/dL (ref 70–99)
Potassium: 3 mmol/L — ABNORMAL LOW (ref 3.5–5.1)
Sodium: 140 mmol/L (ref 135–145)
Total Bilirubin: 0.7 mg/dL (ref 0.3–1.2)
Total Protein: 7.3 g/dL (ref 6.5–8.1)

## 2020-01-16 LAB — D-DIMER, QUANTITATIVE: D-Dimer, Quant: 0.66 ug/mL-FEU — ABNORMAL HIGH (ref 0.00–0.50)

## 2020-01-16 LAB — CBC
HCT: 39.1 % (ref 36.0–46.0)
Hemoglobin: 12.6 g/dL (ref 12.0–15.0)
MCH: 28.3 pg (ref 26.0–34.0)
MCHC: 32.2 g/dL (ref 30.0–36.0)
MCV: 87.7 fL (ref 80.0–100.0)
Platelets: 230 10*3/uL (ref 150–400)
RBC: 4.46 MIL/uL (ref 3.87–5.11)
RDW: 14 % (ref 11.5–15.5)
WBC: 4.7 10*3/uL (ref 4.0–10.5)
nRBC: 0 % (ref 0.0–0.2)

## 2020-01-16 LAB — FERRITIN: Ferritin: 145 ng/mL (ref 11–307)

## 2020-01-16 LAB — I-STAT BETA HCG BLOOD, ED (MC, WL, AP ONLY): I-stat hCG, quantitative: <5 m[IU]/mL (ref <5)

## 2020-01-16 LAB — CREATININE, SERUM
Creatinine, Ser: 0.47 mg/dL (ref 0.44–1.00)
GFR calc Af Amer: >60 mL/min (ref >60)
GFR calc non Af Amer: >60 mL/min (ref >60)

## 2020-01-16 LAB — LACTIC ACID, PLASMA
Lactic Acid, Venous: 1 mmol/L (ref 0.5–1.9)
Lactic Acid, Venous: 1.2 mmol/L (ref 0.5–1.9)

## 2020-01-16 LAB — PROCALCITONIN: Procalcitonin: <0.1 ng/mL

## 2020-01-16 LAB — HIV ANTIBODY (ROUTINE TESTING W REFLEX): HIV Screen 4th Generation wRfx: NONREACTIVE

## 2020-01-16 LAB — TRIGLYCERIDES: Triglycerides: 117 mg/dL (ref <150)

## 2020-01-16 LAB — LACTATE DEHYDROGENASE: LDH: 221 U/L — ABNORMAL HIGH (ref 98–192)

## 2020-01-16 LAB — C-REACTIVE PROTEIN: CRP: 4.4 mg/dL — ABNORMAL HIGH (ref <1.0)

## 2020-01-16 LAB — FIBRINOGEN: Fibrinogen: 539 mg/dL — ABNORMAL HIGH (ref 210–475)

## 2020-01-16 MED ORDER — SODIUM CHLORIDE 0.9 % IV SOLN
200.0000 mg | Freq: Once | INTRAVENOUS | Status: AC
  Administered 2020-01-16: 200 mg via INTRAVENOUS
  Filled 2020-01-16: qty 200

## 2020-01-16 MED ORDER — PANTOPRAZOLE SODIUM 40 MG PO TBEC
80.0000 mg | DELAYED_RELEASE_TABLET | Freq: Every day | ORAL | Status: DC
  Administered 2020-01-17 – 2020-01-20 (×4): 80 mg via ORAL
  Filled 2020-01-16 (×4): qty 2

## 2020-01-16 MED ORDER — ZINC SULFATE 220 (50 ZN) MG PO CAPS
220.0000 mg | ORAL_CAPSULE | Freq: Every day | ORAL | Status: DC
  Administered 2020-01-17 – 2020-01-20 (×4): 220 mg via ORAL
  Filled 2020-01-16 (×4): qty 1

## 2020-01-16 MED ORDER — POLYETHYLENE GLYCOL 3350 17 G PO PACK: 17.0000 g | PACK | Freq: Every day | ORAL | Status: DC | PRN

## 2020-01-16 MED ORDER — ONDANSETRON HCL 4 MG/2ML IJ SOLN: 4.0000 mg | Freq: Four times a day (QID) | INTRAMUSCULAR | Status: DC | PRN

## 2020-01-16 MED ORDER — ASPIRIN 325 MG PO TABS: 325.0000 mg | ORAL_TABLET | Freq: Every day | ORAL | Status: DC

## 2020-01-16 MED ORDER — ACETAMINOPHEN 325 MG PO TABS: 650.0000 mg | ORAL_TABLET | Freq: Four times a day (QID) | ORAL | Status: DC | PRN

## 2020-01-16 MED ORDER — DEXAMETHASONE SODIUM PHOSPHATE 10 MG/ML IJ SOLN
6.0000 mg | INTRAMUSCULAR | Status: DC
  Administered 2020-01-17 – 2020-01-20 (×4): 6 mg via INTRAVENOUS
  Filled 2020-01-16 (×4): qty 1

## 2020-01-16 MED ORDER — ACETAMINOPHEN 650 MG RE SUPP: 650.0000 mg | Freq: Four times a day (QID) | RECTAL | Status: DC | PRN

## 2020-01-16 MED ORDER — SERTRALINE HCL 100 MG PO TABS
100.0000 mg | ORAL_TABLET | Freq: Every day | ORAL | Status: DC
  Administered 2020-01-17 – 2020-01-20 (×4): 100 mg via ORAL
  Filled 2020-01-16 (×4): qty 1

## 2020-01-16 MED ORDER — ONDANSETRON HCL 4 MG PO TABS: 4.0000 mg | ORAL_TABLET | Freq: Four times a day (QID) | ORAL | Status: DC | PRN

## 2020-01-16 MED ORDER — FLUTICASONE PROPIONATE 50 MCG/ACT NA SUSP
1.0000 | Freq: Every day | NASAL | Status: DC | PRN
  Filled 2020-01-16: qty 16

## 2020-01-16 MED ORDER — SODIUM CHLORIDE 0.9 % IV SOLN
100.0000 mg | Freq: Every day | INTRAVENOUS | Status: AC
  Administered 2020-01-17 – 2020-01-20 (×4): 100 mg via INTRAVENOUS
  Filled 2020-01-16 (×4): qty 20

## 2020-01-16 MED ORDER — ENOXAPARIN SODIUM 40 MG/0.4ML ~~LOC~~ SOLN
40.0000 mg | SUBCUTANEOUS | Status: DC
  Administered 2020-01-16 – 2020-01-19 (×4): 40 mg via SUBCUTANEOUS
  Filled 2020-01-16 (×4): qty 0.4

## 2020-01-16 MED ORDER — ALBUTEROL SULFATE HFA 108 (90 BASE) MCG/ACT IN AERS
4.0000 | INHALATION_SPRAY | Freq: Once | RESPIRATORY_TRACT | Status: AC
  Administered 2020-01-16: 4 via RESPIRATORY_TRACT
  Filled 2020-01-16: qty 6.7

## 2020-01-16 MED ORDER — POTASSIUM CHLORIDE CRYS ER 20 MEQ PO TBCR
40.0000 meq | EXTENDED_RELEASE_TABLET | Freq: Once | ORAL | Status: AC
  Administered 2020-01-16: 40 meq via ORAL
  Filled 2020-01-16: qty 2

## 2020-01-16 MED ORDER — ASPIRIN 325 MG PO TABS
325.0000 mg | ORAL_TABLET | Freq: Every day | ORAL | Status: DC
  Administered 2020-01-17 – 2020-01-20 (×4): 325 mg via ORAL
  Filled 2020-01-16 (×4): qty 1

## 2020-01-16 MED ORDER — ASCORBIC ACID 500 MG PO TABS
500.0000 mg | ORAL_TABLET | Freq: Every day | ORAL | Status: DC
  Administered 2020-01-17 – 2020-01-20 (×4): 500 mg via ORAL
  Filled 2020-01-16 (×4): qty 1

## 2020-01-16 MED ORDER — BUPROPION HCL ER (XL) 300 MG PO TB24
300.0000 mg | ORAL_TABLET | Freq: Every day | ORAL | Status: DC
  Administered 2020-01-17 – 2020-01-20 (×4): 300 mg via ORAL
  Filled 2020-01-16 (×4): qty 1

## 2020-01-16 MED ORDER — GUAIFENESIN-DM 100-10 MG/5ML PO SYRP: 10.0000 mL | ORAL_SOLUTION | ORAL | Status: DC | PRN

## 2020-01-16 MED ORDER — POTASSIUM CHLORIDE 10 MEQ/100ML IV SOLN
10.0000 meq | Freq: Once | INTRAVENOUS | Status: AC
  Administered 2020-01-16: 10 meq via INTRAVENOUS
  Filled 2020-01-16: qty 100

## 2020-01-16 MED ORDER — DEXAMETHASONE SODIUM PHOSPHATE 10 MG/ML IJ SOLN
6.0000 mg | Freq: Once | INTRAMUSCULAR | Status: AC
  Administered 2020-01-16: 14:00:00 6 mg via INTRAVENOUS
  Filled 2020-01-16: qty 1

## 2020-01-16 MED ORDER — IPRATROPIUM-ALBUTEROL 20-100 MCG/ACT IN AERS
1.0000 | INHALATION_SPRAY | Freq: Four times a day (QID) | RESPIRATORY_TRACT | Status: DC
  Administered 2020-01-16 – 2020-01-20 (×13): 1 via RESPIRATORY_TRACT
  Filled 2020-01-16: qty 4

## 2020-01-16 MED ORDER — CLOPIDOGREL BISULFATE 75 MG PO TABS
75.0000 mg | ORAL_TABLET | Freq: Every day | ORAL | Status: DC
  Administered 2020-01-16 – 2020-01-19 (×4): 75 mg via ORAL
  Filled 2020-01-16 (×4): qty 1

## 2020-01-16 NOTE — H&P (Addendum)
History and Physical    Stephanie [name] [id number] DOB: October 31, 1971 DOA: 01/16/2020  PCP: [name], MD  Patient coming from: Home  I have personally briefly reviewed patient's old medical records in [place]  Chief Complaint: Shortness of breath  HPI: Stephanie [name] is a 49 y.o. 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  Patient tested positive for Covid-19 on 01/07/2020.  Since then, has had progressive shortness of breath, especially with exertion/ambulation.  Patient has also had associated loss of taste/smell, which is slightly improved.  Also reports diarrhea during this timeframe as well.  ED Course: Temperature 98.1, HR 5079, RR 20, BP 121/80, SPO2 96% on 2 L nasal cannula, was 85% on ambulation.  WBC count 5.8, hemoglobin 13.4, platelets 239, sodium 140, potassium 3.0, chloride 103, CO2 27, BUN 11, creatinine 0.65, glucose 94.  AST 62, ALT 73, LDH 22, ferritin 145, CRP 4.4, lactic acid 1.0, procalcitonin less than 0.10.  hCG less than 5.0, D-dimer 0.66.  Chest x-ray with subtle airspace opacities bilateral bases.  Blood cultures x2: Pending.  Patient was started on IV Decadron and remdesivir in the ED, and referred for admission given hypoxia with exertion secondary to Covid-19 viral pneumonia.  Review of Systems: As per HPI otherwise 10 point review of systems negative.    Past Medical History:  Diagnosis Date  . Anxiety   . GERD (gastroesophageal reflux disease)   . Menorrhagia   . Pneumonia   . Wears contact lenses     Past Surgical History:  Procedure Laterality Date  . ANTERIOR CERVICAL DECOMP/DISCECTOMY FUSION  06/ 2015   C4 -- C5  . BACK SURGERY    . CESAREAN SECTION  03-29-2002/  1997/  1994  . CHOLECYSTECTOMY  08/12/2012   Procedure: LAPAROSCOPIC CHOLECYSTECTOMY WITH INTRAOPERATIVE CHOLANGIOGRAM;  Surgeon: Earnstine Regal, MD;  Location: [facility];  Service: General;  Laterality: N/A;  . DILITATION & CURRETTAGE/HYSTROSCOPY WITH NOVASURE ABLATION N/A 10/26/2014   Procedure: DILATATION & CURETTAGE/HYSTEROSCOPY WITH ATTEMPTED NOVASURE ABLATION WITH IUD REMOVAL;  Surgeon: Maisie Fus, MD;  Location: [facility];  Service: Gynecology;  Laterality: N/A;  . INTRAUTERINE DEVICE (IUD) INSERTION  2010  . IR ANGIO INTRA EXTRACRAN SEL COM CAROTID INNOMINATE UNI R MOD SED  12/12/2019  . IR CT HEAD LTD  12/12/2019  . IR RADIOLOGIST EVAL & MGMT  06/22/2018  . IR TRANSCATH PLC STENT 1ST ART NOT LE CV CAR VERT CAR  12/12/2019  . IR VENO/JUGULAR RIGHT  12/12/2019  . LAPAROSCOPIC TUBAL LIGATION Bilateral 10/26/2014   Procedure: LAPAROSCOPIC TUBAL LIGATION;  Surgeon: Maisie Fus, MD;  [facility];  Service: Gynecology;  Laterality: Bilateral;  . LUMBAR LAMINECTOMY/DECOMPRESSION MICRODISCECTOMY  02/10/2012   Procedure: LUMBAR LAMINECTOMY/DECOMPRESSION MICRODISCECTOMY 1 LEVEL;  Surgeon: Charlie Pitter, MD;  Location: [facility];  Service: Neurosurgery;  Laterality: Left;  Left Lumbar Four-Five Laminectomy and Microdiskectomy  . RADIOLOGY WITH ANESTHESIA Bilateral 12/12/2019   Procedure: STENTING;  Surgeon: Luanne Bras, MD;  Location: [facility];  Service: Radiology;  Laterality: Bilateral;  . REMOVAL BENIGN THROAT CYST  age 57     reports that she has never smoked. She has never used smokeless tobacco. She reports that she does not drink alcohol or use drugs.  Allergies  Allergen Reactions  . Morphine And Related Itching and Nausea And Vomiting  Family History  Problem Relation Age of Onset  . Anesthesia problems Mother     Family history reviewed and not pertinent   Prior to Admission medications   Medication Sig Start Date End Date Taking? Authorizing Provider  acetaminophen (TYLENOL) 325 MG tablet Take 650 mg by mouth every 6 (six) hours as needed for mild pain or  headache.   Yes [provider]  aspirin 325 MG tablet Take 1 tablet (325 mg total) by mouth daily. 12/13/19  Yes Bruning, Lennette Bihari, PA-C  buPROPion (WELLBUTRIN XL) 300 MG 24 hr tablet Take 300 mg by mouth daily.    Yes [provider]  clopidogrel (PLAVIX) 75 MG tablet Take 1 tablet (75 mg total) by mouth daily. 12/01/19  Yes Bruning, Lennette Bihari, PA-C  dextromethorphan-guaiFENesin (MUCINEX DM) 30-600 MG 12hr tablet Take 1 tablet by mouth 2 (two) times daily as needed for cough.   Yes [provider]  fluticasone (FLONASE) 50 MCG/ACT nasal spray Place 1 spray into both nostrils daily as needed for allergies or rhinitis.   Yes [provider]  omeprazole (PRILOSEC) 40 MG capsule Take 40 mg by mouth daily.   Yes [provider]  sertraline (ZOLOFT) 100 MG tablet Take 100 mg by mouth daily.   Yes [provider]  VITAMIN D PO Take 1 capsule by mouth daily.   Yes [provider]  levofloxacin (LEVAQUIN) 500 MG tablet Take 500 mg by mouth daily. 01/04/20   [provider]    Physical Exam: Vitals:   01/16/20 1433 01/16/20 1530 01/16/20 1600 01/16/20 1630  BP: (!) 114/100 121/80 (!) 138/91 133/85  Pulse: 87 79 85 82  Resp: (!) 21 20 (!) 25 (!) 21  Temp:      TempSrc:      SpO2: 98% 98% 98% 99%    Constitutional: NAD, calm, comfortable, obese Eyes: PERRL, lids and conjunctivae normal ENMT: Mucous membranes are moist. Posterior pharynx clear of any exudate or lesions.Normal dentition.  Neck: normal, supple, no masses, no thyromegaly Respiratory: clear to auscultation bilaterally, no wheezing, no crackles. Normal respiratory effort. No accessory muscle use.  On 2 L nasal cannula oxygenating 96%. Cardiovascular: Regular rate and rhythm, no murmurs / rubs / gallops. No extremity edema. 2+ pedal pulses. No carotid bruits.  Abdomen: no tenderness, no masses palpated. No hepatosplenomegaly. Bowel sounds positive.  Musculoskeletal: no  clubbing / cyanosis. No joint deformity upper and lower extremities. Good ROM, no contractures. Normal muscle tone.  Skin: no rashes, lesions, ulcers. No induration Neurologic: CN 2-12 grossly intact. Sensation intact, DTR normal. Strength 5/5 in all 4.  Psychiatric: Normal judgment and insight. Alert and oriented x 3. Normal mood.    Labs on Admission: I have personally reviewed following labs and imaging studies  CBC: Recent Labs  Lab 01/12/20 0913 01/16/20 1334  WBC 3.8* 5.5  NEUTROABS 2.8 3.9  HGB 13.2 13.4  HCT 41.4 41.4  MCV 88.3 88.8  PLT 183 [id number]   Basic Metabolic Panel: Recent Labs  Lab 01/12/20 0913 01/16/20 1334  NA 140 140  K 2.8* 3.0*  CL 105 103  CO2 26 27  GLUCOSE 106* 94  BUN 9 11  CREATININE 0.63 0.65  CALCIUM 8.4* 8.4*   GFR: Estimated Creatinine Clearance: 103.9 mL/min (by C-G formula based on SCr of 0.65 mg/dL). Liver Function Tests: Recent Labs  Lab 01/12/20 0913 01/16/20 1334  AST 50* 62*  ALT 67* 73*  ALKPHOS 59 62  BILITOT 0.3 0.7  PROT  7.3 7.3  ALBUMIN 3.8 3.8   No results for input(s): LIPASE, AMYLASE in the last 168 hours. No results for input(s): AMMONIA in the last 168 hours. Coagulation Profile: No results for input(s): INR, PROTIME in the last 168 hours. Cardiac Enzymes: No results for input(s): CKTOTAL, CKMB, CKMBINDEX, TROPONINI in the last 168 hours. BNP (last 3 results) No results for input(s): PROBNP in the last 8760 hours. HbA1C: No results for input(s): HGBA1C in the last 72 hours. CBG: No results for input(s): GLUCAP in the last 168 hours. Lipid Profile: Recent Labs    01/16/20 1334  TRIG 117   Thyroid Function Tests: No results for input(s): TSH, T4TOTAL, FREET4, T3FREE, THYROIDAB in the last 72 hours. Anemia Panel: Recent Labs    01/16/20 1334  FERRITIN 145   Urine analysis:    Component Value Date/Time   COLORURINE YELLOW 12/12/2019 0711   APPEARANCEUR CLEAR 12/12/2019 0711   LABSPEC 1.021  12/12/2019 0711   PHURINE 5.0 12/12/2019 0711   GLUCOSEU NEGATIVE 12/12/2019 0711   GLUCOSEU NEGATIVE 05/08/2009 1153   HGBUR SMALL (A) 12/12/2019 0711   BILIRUBINUR NEGATIVE 12/12/2019 0711   KETONESUR NEGATIVE 12/12/2019 0711   PROTEINUR NEGATIVE 12/12/2019 0711   UROBILINOGEN 0.2 05/08/2009 1153   NITRITE NEGATIVE 12/12/2019 0711   LEUKOCYTESUR NEGATIVE 12/12/2019 0711    Radiological Exams on Admission: DG Chest Port 1 View  Result Date: 01/16/2020 CLINICAL DATA:  COVID, shortness of breath EXAM: PORTABLE CHEST 1 VIEW COMPARISON:  01/12/2020 FINDINGS: The heart size and mediastinal contours are within normal limits. Subtle heterogeneous airspace opacity of the lung bases, similar to prior examination. The visualized skeletal structures are unremarkable. IMPRESSION: Subtle heterogeneous airspace opacity of the lung bases, similar to prior examination and generally in keeping with COVID infection. No new airspace opacity. Electronically Signed   By: Eddie Candle M.D.   On: 01/16/2020 13:48    EKG: Independently reviewed.   Assessment/Plan Principal Problem:   Pneumonia due to COVID-19 virus Active Problems:   Depression   Obesity, Class III, BMI 40-49.9 (morbid obesity) (HCC)   Hypokalemia   Elevated LFTs    Acute hypoxic respiratory failure secondary to acute Covid-19 viral pneumonia during the ongoing  Covid 19 Pandemic - POA Patient presenting with progressive shortness of breath, positive Covid-19 on 01/07/2020.  Noted to be hypoxic on ambulation with SPO2 85%.  Elevated inflammatory markers and chest x-ray consistent with viral pneumonic process. --Remdesivir, plan 5-day course --Decadron 6 mg IV daily. --Combivent MDI every 6 hours --Continue supplemental oxygen, titrate to maintain SPO2 greater than 92% --Continue supportive care with vitamin C, zinc, Tylenol --Follow CBC, CMP, D-dimer, ferritin, and CRP daily --Continue airborne/contact isolation  precautions  Elevated LFTs AST 62, ALT 73.  Etiology likely secondary to Covid-19 viral process. --Monitor CMP daily  Hypokalemia Potassium 3.0, repleted with 10 mEq KCl IV and K-Dur 40 mEq p.o. --Repeat electrolytes in a.m. to include magnesium  History of venous sinus stenosis status post stenting --Continue aspirin and Plavix  Depression: Continue Wellbutrin/sertraline  Morbid obesity BMI 42.91.  Discussed with patient needs for aggressive lifestyle changes/weight loss as this complicates all facets of care.   DVT prophylaxis: Lovenox Code Status: Full code Family Communication: updated patients spouse, Legrand Como Disposition Plan: Anticipate discharge home when oxygen titrated off Consults called: None Admission status: Inpatient, med surgical unit   Severity of Illness: The appropriate patient status for this patient is INPATIENT. Inpatient status is judged to be reasonable and necessary in  order to provide the required intensity of service to ensure the patient's safety. The patient's presenting symptoms, physical exam findings, and initial radiographic and laboratory data in the context of their chronic comorbidities is felt to place them at high risk for further clinical deterioration. Furthermore, it is not anticipated that the patient will be medically stable for discharge from the hospital within 2 midnights of admission. The following factors support the patient status of inpatient.   " The patient's presenting symptoms include shortness of breath with exertion, loss of taste/smell, diarrhea, weakness/fatigue " The worrisome physical exam findings include hypoxia on ambulation " The initial radiographic and laboratory data are worrisome because of elevated inflammatory markers, elevated LFTs " The chronic co-morbidities include depression, obesity.   * I certify that at the point of admission it is my clinical judgment that the patient will require inpatient hospital care  spanning beyond 2 midnights from the point of admission due to high intensity of service, high risk for further deterioration and high frequency of surveillance required.*    Rainier Feuerborn J [place] DO [facility] Available via Epic secure chat 7am-7pm After these hours, please refer to coverage provider listed on [email or web address] 01/16/2020, 4:37 PM

## 2020-01-16 NOTE — ED Triage Notes (Signed)
Pt arrives today c/o SHOB. Pt was dx with COVID 1/16. Pt has labored breathing with walking. Upon entry to triage room, pt sats at 85%, improving to 94% after sitting down for ~ 5 minutes.

## 2020-01-16 NOTE — ED Provider Notes (Signed)
Stephanie [name] DEPT Provider Note   CSN: [id number] Arrival date & time: 01/16/20  1308     History Chief Complaint  Patient presents with  . Shortness of Breath    Stephanie [name] is a 49 y.o. female.  Stephanie [name] is a 49 y.o. female with a history of pneumonia, GERD and anxiety, who presents with worsening shortness of breath.  The patient was diagnosed with Covid on 1/16.  Patient was seen in the ED on 1/21 and had reassuring evaluation and maintained normal O2 sats, but states that since being discharged home she has had worsening shortness of breath.  She reports when she is at rest she feels okay but with any activity she becomes extremely short of breath.  She currently becomes lightheaded but has not passed out.  She reports frequent cough.  She denies associated chest pain.  Denies vomiting, diarrhea or abdominal pain.  Reports she feels generally weak and fatigued with decreased appetite.  No oxygen requirement at baseline, no history of asthma, lung disease or smoking.  No other aggravating or alleviating factors.        Past Medical History:  Diagnosis Date  . Anxiety   . GERD (gastroesophageal reflux disease)   . Menorrhagia   . Pneumonia   . Wears contact lenses     Patient Active Problem List   Diagnosis Date Noted  . Pulsatile tinnitus of right ear 12/12/2019  . Herpes zoster without complication [id number]  . Cholelithiasis 08/12/2012  . 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 DIAPHORESIS 06/01/2009  . PHARYNGITIS, STREPTOCOCCAL 05/08/2009  . ABNORMAL WEIGHT GAIN 05/08/2009    Past Surgical History:  Procedure Laterality Date  . ANTERIOR CERVICAL DECOMP/DISCECTOMY FUSION  06/ 2015   C4 -- C5  . BACK SURGERY    . CESAREAN SECTION  03-29-2002/  1997/  1994  . CHOLECYSTECTOMY  08/12/2012   Procedure: LAPAROSCOPIC CHOLECYSTECTOMY WITH INTRAOPERATIVE CHOLANGIOGRAM;  Surgeon: Earnstine Regal, MD;  Location: [facility];   Service: General;  Laterality: N/A;  . DILITATION & CURRETTAGE/HYSTROSCOPY WITH NOVASURE ABLATION N/A 10/26/2014   Procedure: DILATATION & CURETTAGE/HYSTEROSCOPY WITH ATTEMPTED NOVASURE ABLATION WITH IUD REMOVAL;  Surgeon: Maisie Fus, MD;  Location: [place];  Service: Gynecology;  Laterality: N/A;  . INTRAUTERINE DEVICE (IUD) INSERTION  2010  . IR ANGIO INTRA EXTRACRAN SEL COM CAROTID INNOMINATE UNI R MOD SED  12/12/2019  . IR CT HEAD LTD  12/12/2019  . IR RADIOLOGIST EVAL & MGMT  06/22/2018  . IR TRANSCATH PLC STENT 1ST ART NOT LE CV CAR VERT CAR  12/12/2019  . IR VENO/JUGULAR RIGHT  12/12/2019  . LAPAROSCOPIC TUBAL LIGATION Bilateral 10/26/2014   Procedure: LAPAROSCOPIC TUBAL LIGATION;  Surgeon: Maisie Fus, MD;  Location: [facility];  Service: Gynecology;  Laterality: Bilateral;  . LUMBAR LAMINECTOMY/DECOMPRESSION MICRODISCECTOMY  02/10/2012   Procedure: LUMBAR LAMINECTOMY/DECOMPRESSION MICRODISCECTOMY 1 LEVEL;  Surgeon: Charlie Pitter, MD;  Location: [facility];  Service: Neurosurgery;  Laterality: Left;  Left Lumbar Four-Five Laminectomy and Microdiskectomy  . RADIOLOGY WITH ANESTHESIA Bilateral 12/12/2019   Procedure: STENTING;  Surgeon: Luanne Bras, MD;  Location: [place];  Service: Radiology;  Laterality: Bilateral;  . REMOVAL BENIGN THROAT CYST  age 21     OB History   No obstetric history on file.     Family History  Problem Relation Age of Onset  . Anesthesia problems Mother     Social History  Tobacco Use  . Smoking status: Never Smoker  . Smokeless tobacco: Never Used  Substance Use Topics  . Alcohol use: No  . Drug use: No    Home Medications Prior to Admission medications   Medication Sig Start Date End Date Taking? Authorizing Provider  aspirin 325 MG tablet Take 1 tablet (325 mg total) by mouth daily. 12/13/19   Ascencion Dike, PA-C  buPROPion (WELLBUTRIN XL) 300 MG 24 hr tablet Take 300 mg by mouth daily.      [provider]  clopidogrel (PLAVIX) 75 MG tablet Take 1 tablet (75 mg total) by mouth daily. 12/01/19   Ascencion Dike, PA-C  levofloxacin (LEVAQUIN) 500 MG tablet Take 500 mg by mouth daily. 01/04/20   [provider]  omeprazole (PRILOSEC) 40 MG capsule Take 40 mg by mouth daily.    [provider]  sertraline (ZOLOFT) 100 MG tablet Take 100 mg by mouth daily.    [provider]    Allergies    Morphine and related  Review of Systems   Review of Systems  Constitutional: Positive for chills and fatigue. Negative for fever.  HENT: Negative.   Respiratory: Positive for cough and shortness of breath.   Cardiovascular: Negative for chest pain and leg swelling.  Gastrointestinal: Negative for abdominal pain, nausea and vomiting.  Genitourinary: Negative for dysuria and frequency.  Musculoskeletal: Positive for myalgias. Negative for arthralgias.  Skin: Negative for color change and rash.  Neurological: Positive for light-headedness. Negative for syncope and headaches.    Physical Exam Updated Vital Signs BP 128/75 (BP Location: Left Arm)   Pulse 90   Temp 98.1 F (36.7 C) (Oral)   Resp 20   LMP 07/12/2012   SpO2 93%   Physical Exam Vitals and nursing note reviewed.  Constitutional:      General: She is not in acute distress.    Appearance: She is well-developed and normal weight. She is not ill-appearing or diaphoretic.  HENT:     Head: Normocephalic and atraumatic.  Eyes:     General:        Right eye: No discharge.        Left eye: No discharge.     Pupils: Pupils are equal, round, and reactive to light.  Cardiovascular:     Rate and Rhythm: Normal rate and regular rhythm.     Heart sounds: Normal heart sounds. No murmur. No friction rub. No gallop.   Pulmonary:     Effort: Tachypnea present. No respiratory distress.     Breath sounds: Normal breath sounds. No wheezing or rales.     Comments: Patient is tachypneic with some  increased respiratory effort, satting well at rest but with any activity patient desats to 85-89%.  On auscultation lungs are clear aside from some decreased breath sounds in bilateral bases. Chest:     Chest wall: No tenderness.  Abdominal:     General: Bowel sounds are normal. There is no distension.     Palpations: Abdomen is soft. There is no mass.     Tenderness: There is no abdominal tenderness. There is no guarding.  Musculoskeletal:        General: No deformity.     Cervical back: Neck supple.     Right lower leg: No edema.     Left lower leg: No edema.  Skin:    General: Skin is warm and dry.     Capillary Refill: Capillary refill takes less than 2 seconds.  Neurological:  Mental Status: She is alert.     Coordination: Coordination normal.     Comments: Speech is clear, able to follow commands Moves extremities without ataxia, coordination intact  Psychiatric:        Mood and Affect: Mood normal.        Behavior: Behavior normal.     ED Results / Procedures / Treatments   Labs (all labs ordered are listed, but only abnormal results are displayed) Labs Reviewed  COMPREHENSIVE METABOLIC PANEL - Abnormal; Notable for the following components:      Result Value   Potassium 3.0 (*)    Calcium 8.4 (*)    AST 62 (*)    ALT 73 (*)    All other components within normal limits  D-DIMER, QUANTITATIVE (NOT AT [facility]) - Abnormal; Notable for the following components:   D-Dimer, Quant 0.66 (*)    All other components within normal limits  LACTATE DEHYDROGENASE - Abnormal; Notable for the following components:   LDH 221 (*)    All other components within normal limits  FIBRINOGEN - Abnormal; Notable for the following components:   Fibrinogen 539 (*)    All other components within normal limits  C-REACTIVE PROTEIN - Abnormal; Notable for the following components:   CRP 4.4 (*)    All other components within normal limits  CULTURE, BLOOD (ROUTINE X 2)  CULTURE, BLOOD  (ROUTINE X 2)  LACTIC ACID, PLASMA  CBC WITH DIFFERENTIAL/PLATELET  PROCALCITONIN  FERRITIN  TRIGLYCERIDES  LACTIC ACID, PLASMA  I-STAT BETA HCG BLOOD, ED ([facility], [facility], [facility] ONLY)    EKG None  Radiology DG Chest Port 1 View  Result Date: 01/16/2020 CLINICAL DATA:  COVID, shortness of breath EXAM: PORTABLE CHEST 1 VIEW COMPARISON:  01/12/2020 FINDINGS: The heart size and mediastinal contours are within normal limits. Subtle heterogeneous airspace opacity of the lung bases, similar to prior examination. The visualized skeletal structures are unremarkable. IMPRESSION: Subtle heterogeneous airspace opacity of the lung bases, similar to prior examination and generally in keeping with COVID infection. No new airspace opacity. Electronically Signed   By: Eddie Candle M.D.   On: 01/16/2020 13:48    Procedures Procedures (including critical care time)  Medications Ordered in ED Medications  remdesivir 200 mg in sodium chloride 0.9% 250 mL IVPB (has no administration in time range)    Followed by  remdesivir 100 mg in sodium chloride 0.9 % 100 mL IVPB (has no administration in time range)  albuterol (VENTOLIN HFA) 108 (90 Base) MCG/ACT inhaler 4 puff (4 puffs Inhalation Given 01/16/20 1427)  dexamethasone (DECADRON) injection 6 mg (6 mg Intravenous Given 01/16/20 1428)  potassium chloride 10 mEq in 100 mL IVPB (10 mEq Intravenous New Bag/Given 01/16/20 1556)  potassium chloride SA (KLOR-CON) CR tablet 40 mEq (40 mEq Oral Given 01/16/20 1557)    ED Course  I have reviewed the triage vital signs and the nursing notes.  Pertinent labs & imaging results that were available during my care of the patient were reviewed by me and considered in my medical decision making (see chart for details).    MDM Rules/Calculators/A&P                      49 year old female presents with worsening shortness of breath, initially diagnosed with Covid on 1/16.  On arrival patient desatted to 85% after walking in from  the waiting room.  She has increased work of breathing and tachypnea.  Patient placed on 2 L  nasal cannula with improvement in her work of breathing.  Chest x-ray shows mild bilateral infiltrates.  Her lab work shows mild hypokalemia no other significant electrolyte derangements, no leukocytosis, normal hemoglobin and platelets.  Mild elevation of some of her inflammatory markers.  Patient treated with IV steroids, albuterol, and remdesivir, will plan for admission for Covid with new oxygen requirement.  Case discussed with Dr. [place] with [facility] who will see and admit the patient.  Final Clinical Impression(s) / ED Diagnoses Final diagnoses:  Acute hypoxemic respiratory failure due to COVID-19 [facility])    Rx / DC Orders ED Discharge Orders    None       Janet Berlin 01/16/20 1715    Milton Ferguson, MD 01/17/20 [id number]

## 2020-01-16 NOTE — Progress Notes (Signed)
Pt arrived to unit. Resting in bed with call light within reach. A/O x4. Voices no pain at this time. VSS. Pt requiring 2L O2 via [place]. SOB noted at rest. Diet order submitted. Will cont to mx.

## 2020-01-16 NOTE — Progress Notes (Signed)
Diet order placed. Called dietary to bring pt a dinner tray.

## 2020-01-17 DIAGNOSIS — J9601 Acute respiratory failure with hypoxia: Secondary | ICD-10-CM

## 2020-01-17 DIAGNOSIS — R7989 Other specified abnormal findings of blood chemistry: Secondary | ICD-10-CM

## 2020-01-17 DIAGNOSIS — E876 Hypokalemia: Secondary | ICD-10-CM

## 2020-01-17 DIAGNOSIS — F329 Major depressive disorder, single episode, unspecified: Secondary | ICD-10-CM

## 2020-01-17 LAB — CBC WITH DIFFERENTIAL/PLATELET
Abs Immature Granulocytes: 0.01 10*3/uL (ref 0.00–0.07)
Basophils Absolute: 0 10*3/uL (ref 0.0–0.1)
Basophils Relative: 0 %
Eosinophils Absolute: 0 10*3/uL (ref 0.0–0.5)
Eosinophils Relative: 0 %
HCT: 37.9 % (ref 36.0–46.0)
Hemoglobin: 12.1 g/dL (ref 12.0–15.0)
Immature Granulocytes: 0 %
Lymphocytes Relative: 27 %
Lymphs Abs: 0.9 10*3/uL (ref 0.7–4.0)
MCH: 28.4 pg (ref 26.0–34.0)
MCHC: 31.9 g/dL (ref 30.0–36.0)
MCV: 89 fL (ref 80.0–100.0)
Monocytes Absolute: 0.3 10*3/uL (ref 0.1–1.0)
Monocytes Relative: 8 %
Neutro Abs: 2.1 10*3/uL (ref 1.7–7.7)
Neutrophils Relative %: 65 %
Platelets: 235 10*3/uL (ref 150–400)
RBC: 4.26 MIL/uL (ref 3.87–5.11)
RDW: 14.1 % (ref 11.5–15.5)
WBC: 3.2 10*3/uL — ABNORMAL LOW (ref 4.0–10.5)
nRBC: 0 % (ref 0.0–0.2)

## 2020-01-17 LAB — CULTURE, BLOOD (ROUTINE X 2)
Culture: NO GROWTH
Special Requests: ADEQUATE

## 2020-01-17 LAB — FERRITIN: Ferritin: 120 ng/mL (ref 11–307)

## 2020-01-17 LAB — ABO/RH: ABO/RH(D): A POS

## 2020-01-17 LAB — MAGNESIUM: Magnesium: 2.2 mg/dL (ref 1.7–2.4)

## 2020-01-17 LAB — C-REACTIVE PROTEIN: CRP: 3.4 mg/dL — ABNORMAL HIGH (ref <1.0)

## 2020-01-17 LAB — D-DIMER, QUANTITATIVE: D-Dimer, Quant: 0.66 ug/mL-FEU — ABNORMAL HIGH (ref 0.00–0.50)

## 2020-01-17 NOTE — Progress Notes (Signed)
PROGRESS NOTE    Stephanie [name]  [id number]  DOB: [birth year]  PCP: [name], MD Admit date:01/16/2020  49 y.o. 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  Patient tested positive for Covid-19 on 01/07/2020.  Since then, has had progressive shortness of breath, especially with exertion/ambulation.  Patient has also had associated diarrhea,loss of taste/smell, which is slightly improved.   ED Course: Afebrile,RR 20, BP 121/80, SPO2 96% on 2 L nasal cannula, was 85% on ambulation.  WBC count 5.8, hemoglobin 13.4, platelets 239, sodium 140, potassium 3.0, chloride 103, CO2 27, BUN 11, creatinine 0.65, glucose 94.  AST 62, ALT 73, LDH 22, ferritin 145, CRP 4.4, lactic acid 1.0, procalcitonin less than 0.10.  hCG less than 5.0, D-dimer 0.66.  Chest x-ray with subtle airspace opacities bilateral bases.  Blood cultures x2: Pending.  Patient was started on IV Decadron and remdesivir in the ED Hospital course: Patient admitted to [facility] for COVID 19 pneumonia and associated hypoxia on exertion.  Subjective:  Patient resting comfortably and eating lunch during rounds.  She does appear slightly dyspneic while talking full sentences.  Remains on 2 L O2  Objective: Vitals:   01/16/20 1630 01/16/20 1744 01/16/20 2134 01/17/20 0542  BP: 133/85 133/85 124/75 (!) 144/84  Pulse: 82 82 84 70  Resp: (!) 21 (!) 21 18 16   Temp:  98.1 F (36.7 C) 98.1 F (36.7 C) 97.9 F (36.6 C)  TempSrc:  Oral Oral Oral  SpO2: 99%  96% 98%  Weight:  113.4 kg    Height:  5\' 4"  (1.626 m)      Intake/Output Summary (Last 24 hours) at 01/17/2020 0938 Last data filed at 01/17/2020 0844 Gross per 24 hour  Intake 840 ml  Output 0 ml  Net 840 ml   Filed Weights   01/16/20 1744  Weight: 113.4 kg    Physical Examination:  General exam: Appears calm and comfortable on 2 L  O2 Respiratory system: Clear to auscultation. Respiratory effort slightly increased while talking full sentences Cardiovascular system: S1 & S2 heard, RRR. No JVD, murmurs. No pedal edema. Gastrointestinal system: Abdomen is nondistended, soft and nontender. Normal bowel sounds heard. Central nervous system: Alert and oriented. No new focal neurological deficits. Extremities: No contractures, edema or joint deformities.  Skin: No rashes, lesions or ulcers Psychiatry: Judgement and insight appear normal. Mood & affect appropriate.   Data Reviewed: I have personally reviewed following labs and imaging studies  CBC: Recent Labs  Lab 01/12/20 0913 01/16/20 1334 01/16/20 1930 01/17/20 0356  WBC 3.8* 5.5 4.7 3.2*  NEUTROABS 2.8 3.9  --  2.1  HGB 13.2 13.4 12.6 12.1  HCT 41.4 41.4 39.1 37.9  MCV 88.3 88.8 87.7 89.0  PLT 183 239 230 [id number]   Basic Metabolic Panel: Recent Labs  Lab 01/12/20 0913 01/16/20 1334 01/16/20 1930 01/17/20 0356  NA 140 140  --   --   K 2.8* 3.0*  --   --   CL 105 103  --   --   CO2 26 27  --   --   GLUCOSE 106* 94  --   --   BUN 9 11  --   --   CREATININE 0.63 0.65 0.47  --   CALCIUM 8.4* 8.4*  --   --   MG  --   --   --  2.2  GFR: Estimated Creatinine Clearance: 103.9 mL/min (by C-G formula based on SCr of 0.47 mg/dL). Liver Function Tests: Recent Labs  Lab 01/12/20 0913 01/16/20 1334  AST 50* 62*  ALT 67* 73*  ALKPHOS 59 62  BILITOT 0.3 0.7  PROT 7.3 7.3  ALBUMIN 3.8 3.8   No results for input(s): LIPASE, AMYLASE in the last 168 hours. No results for input(s): AMMONIA in the last 168 hours. Coagulation Profile: No results for input(s): INR, PROTIME in the last 168 hours. Cardiac Enzymes: No results for input(s): CKTOTAL, CKMB, CKMBINDEX, TROPONINI in the last 168 hours. BNP (last 3 results) No results for input(s): PROBNP in the last 8760 hours. HbA1C: No results for input(s): HGBA1C in the last 72 hours. CBG: No results for  input(s): GLUCAP in the last 168 hours. Lipid Profile: Recent Labs    01/16/20 1334  TRIG 117   Thyroid Function Tests: No results for input(s): TSH, T4TOTAL, FREET4, T3FREE, THYROIDAB in the last 72 hours. Anemia Panel: Recent Labs    01/16/20 1334 01/17/20 0356  FERRITIN 145 120   Sepsis Labs: Recent Labs  Lab 01/12/20 0913 01/16/20 1334 01/16/20 1931  PROCALCITON <0.10 <0.10  --   LATICACIDVEN 0.8 1.0 1.2    Recent Results (from the past 240 hour(s))  Blood Culture (routine x 2)     Status: None (Preliminary result)   Collection Time: 01/12/20  9:18 AM   Specimen: BLOOD  Result Value Ref Range Status   Specimen Description   Final    BLOOD LEFT ANTECUBITAL Performed at [facility], [place] [address], [place], [place] [address]    Special Requests   Final    BOTTLES DRAWN AEROBIC AND ANAEROBIC Blood Culture adequate volume Performed at [place] [address] [place], [place] [address]    Culture   Final    NO GROWTH 4 DAYS Performed at [facility], [place] [address] [place],  [address]    Report Status PENDING  Incomplete      Radiology Studies: DG Chest Port 1 View  Result Date: 01/16/2020 CLINICAL DATA:  COVID, shortness of breath EXAM: PORTABLE CHEST 1 VIEW COMPARISON:  01/12/2020 FINDINGS: The heart size and mediastinal contours are within normal limits. Subtle heterogeneous airspace opacity of the lung bases, similar to prior examination. The visualized skeletal structures are unremarkable. IMPRESSION: Subtle heterogeneous airspace opacity of the lung bases, similar to prior examination and generally in keeping with COVID infection. No new airspace opacity. Electronically Signed   By: Eddie Candle M.D.   On: 01/16/2020 13:48        Scheduled Meds: . vitamin C  500 mg Oral Daily  . aspirin  325 mg Oral Daily  . buPROPion  300 mg Oral Daily  . clopidogrel  75 mg Oral Daily  . dexamethasone  (DECADRON) injection  6 mg Intravenous Q24H  . enoxaparin (LOVENOX) injection  40 mg Subcutaneous Q24H  . Ipratropium-Albuterol  1 puff Inhalation Q6H  . pantoprazole  80 mg Oral Daily  . sertraline  100 mg Oral Daily  . zinc sulfate  220 mg Oral Daily   Continuous Infusions: . remdesivir 100 mg in NS 100 mL      Assessment & Plan:   Acute hypoxic respiratory failure secondary to acute Covid-19 viral pneumonia - POA Patient presenting with progressive shortness of breath, positive Covid-19 on 01/07/2020.  Noted to be hypoxic on ambulation with SPO2 85%.  Elevated inflammatory markers and chest  x-ray consistent with viral pneumonic process. Started on Decadron 6 mg IV daily as well as Remdesivir (day 2), plan 5-day course. Cont Combivent MDI every 6 hours,vitamin C, zinc . Continue supplemental oxygen, titrate to maintain SPO2 greater than 92%.Follow CBC, CMP, D-dimer, ferritin, and CRP daily.Continue airborne/contact isolation precautions  Elevated LFTs-AST 62, ALT 73.  Etiology likely secondary to Covid-19 viral process. Monitor CMP daily  Hypokalemia-Potassium 3.0, repleted with 10 mEq KCl IV and K-Dur 40 mEq p.o. Repeat electrolytes in a.m. to include magnesium  History of venous sinus stenosis status post stenting-Continue aspirin and Plavix  Depression: Continue Wellbutrin/sertraline  Morbid obesity-BMI 42.91.  Discussed with patient needs for aggressive lifestyle changes/weight loss as this complicates all facets of care.    DVT prophylaxis: Lovenox Code Status: Full code Family / Patient Communication: Discussed with patient and answered all questions to satisfaction Disposition Plan: Home when medically cleared--completes IV remdesivir therapy and hopefully off O2     LOS: 1 day    Time spent: 35 minutes    Guilford Shi, MD [facility] Pager [phone]  If 7PM-7AM, please contact night-coverage [email or web address] Password [facility] 01/17/2020, 9:38 AM

## 2020-01-18 LAB — CBC WITH DIFFERENTIAL/PLATELET
Abs Immature Granulocytes: 0.02 10*3/uL (ref 0.00–0.07)
Basophils Absolute: 0 10*3/uL (ref 0.0–0.1)
Basophils Relative: 0 %
Eosinophils Absolute: 0 10*3/uL (ref 0.0–0.5)
Eosinophils Relative: 0 %
HCT: 37.7 % (ref 36.0–46.0)
Hemoglobin: 12 g/dL (ref 12.0–15.0)
Immature Granulocytes: 0 %
Lymphocytes Relative: 25 %
Lymphs Abs: 1.2 10*3/uL (ref 0.7–4.0)
MCH: 28.5 pg (ref 26.0–34.0)
MCHC: 31.8 g/dL (ref 30.0–36.0)
MCV: 89.5 fL (ref 80.0–100.0)
Monocytes Absolute: 0.5 10*3/uL (ref 0.1–1.0)
Monocytes Relative: 10 %
Neutro Abs: 3.1 10*3/uL (ref 1.7–7.7)
Neutrophils Relative %: 65 %
Platelets: 306 10*3/uL (ref 150–400)
RBC: 4.21 MIL/uL (ref 3.87–5.11)
RDW: 14.3 % (ref 11.5–15.5)
WBC: 4.8 10*3/uL (ref 4.0–10.5)
nRBC: 0 % (ref 0.0–0.2)

## 2020-01-18 LAB — COMPREHENSIVE METABOLIC PANEL
ALT: 56 U/L — ABNORMAL HIGH (ref 0–44)
AST: 31 U/L (ref 15–41)
Albumin: 3.6 g/dL (ref 3.5–5.0)
Alkaline Phosphatase: 58 U/L (ref 38–126)
Anion gap: 11 (ref 5–15)
BUN: 14 mg/dL (ref 6–20)
CO2: 26 mmol/L (ref 22–32)
Calcium: 8.4 mg/dL — ABNORMAL LOW (ref 8.9–10.3)
Chloride: 108 mmol/L (ref 98–111)
Creatinine, Ser: 0.67 mg/dL (ref 0.44–1.00)
GFR calc Af Amer: >60 mL/min (ref >60)
GFR calc non Af Amer: >60 mL/min (ref >60)
Glucose, Bld: 108 mg/dL — ABNORMAL HIGH (ref 70–99)
Potassium: 2.9 mmol/L — ABNORMAL LOW (ref 3.5–5.1)
Sodium: 145 mmol/L (ref 135–145)
Total Bilirubin: 0.5 mg/dL (ref 0.3–1.2)
Total Protein: 6.9 g/dL (ref 6.5–8.1)

## 2020-01-18 LAB — D-DIMER, QUANTITATIVE: D-Dimer, Quant: 0.63 ug/mL-FEU — ABNORMAL HIGH (ref 0.00–0.50)

## 2020-01-18 LAB — FERRITIN: Ferritin: 105 ng/mL (ref 11–307)

## 2020-01-18 LAB — MAGNESIUM: Magnesium: 2.2 mg/dL (ref 1.7–2.4)

## 2020-01-18 LAB — C-REACTIVE PROTEIN: CRP: 1.1 mg/dL — ABNORMAL HIGH (ref <1.0)

## 2020-01-18 NOTE — Progress Notes (Signed)
Assessment of patient completed. Breakfast tray served. Pt performed ADLs independently. Reports sleeping in the prone position during the night. Denies pain or acute distress at this time. She is able to ambulate to the bathroom without assistance, reports DOE at times. States she was able to use IS and achieved 800 ml. Encouraged to use IS more today. Verbalized understanding. O2 sat 93% on 2L/ after ambulating to bathroom. Reports she feels stronger and less SOB today with activities. Instructed to call PRN. CBIR. Will continue to monitor.

## 2020-01-18 NOTE — Plan of Care (Signed)
  Problem: Education: Goal: Knowledge of General Education information will improve Description: Including pain rating scale, medication(s)/side effects and non-pharmacologic comfort measures Outcome: Progressing   Problem: Clinical Measurements: Goal: Respiratory complications will improve Outcome: Progressing Goal: Cardiovascular complication will be avoided Outcome: Progressing   Problem: Activity: Goal: Risk for activity intolerance will decrease Outcome: Progressing   Problem: Coping: Goal: Level of anxiety will decrease Outcome: Progressing   Problem: Elimination: Goal: Will not experience complications related to urinary retention Outcome: Progressing   Problem: Safety: Goal: Ability to remain free from injury will improve Outcome: Progressing   Problem: Education: Goal: Knowledge of risk factors and measures for prevention of condition will improve Outcome: Progressing   Problem: Respiratory: Goal: Will maintain a patent airway Outcome: Progressing Goal: Complications related to the disease process, condition or treatment will be avoided or minimized Outcome: Progressing

## 2020-01-18 NOTE — TOC Progression Note (Signed)
Transition of Care [facility] [place] [facility]) - Progression Note    Patient Details  Name: Stephanie [name] MRN: [id number] Date of Birth: 1971/07/16  [facility]) CM/SW Contact  Purcell Mouton, RN Phone Number: 01/18/2020, 3:22 PM  Clinical Narrative:     TOC will follow for discharge needs.        Expected Discharge Plan and Services                                                 Social Determinants of Health (SDOH) Interventions    Readmission Risk Interventions No flowsheet data found.

## 2020-01-18 NOTE — Progress Notes (Signed)
PROGRESS NOTE    Stephanie [name]  [id number]  DOB: 1971-12-06  PCP: [name], MD Admit date:01/16/2020  49 y.o. 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  Patient tested positive for Covid-19 on 01/07/2020.  Since then, has had progressive shortness of breath, especially with exertion/ambulation.  Patient has also had associated diarrhea,loss of taste/smell, which is slightly improved.   ED Course: Afebrile,RR 20, BP 121/80, SPO2 96% on 2 L nasal cannula, was 85% on ambulation.  WBC count 5.8, hemoglobin 13.4, platelets 239, sodium 140, potassium 3.0, chloride 103, CO2 27, BUN 11, creatinine 0.65, glucose 94.  AST 62, ALT 73, LDH 22, ferritin 145, CRP 4.4, lactic acid 1.0, procalcitonin less than 0.10.  hCG less than 5.0, D-dimer 0.66.  Chest x-ray with subtle airspace opacities bilateral bases.  Blood cultures x2: Pending.  Patient was started on IV Decadron and remdesivir in the ED Hospital course: Patient admitted to [facility] for COVID 19 pneumonia and associated hypoxia on exertion.  Subjective:  Patient reports feeling somewhat stronger on exertion.Still requiring 2lits 02 to maintain sats in low 90s  Objective: Vitals:   01/17/20 0542 01/17/20 1400 01/17/20 2154 01/18/20 0524  BP: (!) 144/84 124/75 (!) 142/85 123/73  Pulse: 70 77 74 62  Resp: 16 16 18 16   Temp: 97.9 F (36.6 C) 98.3 F (36.8 C) 98.1 F (36.7 C) 98.2 F (36.8 C)  TempSrc: Oral Oral Oral Oral  SpO2: 98% 96% 95% 99%  Weight:      Height:        Intake/Output Summary (Last 24 hours) at 01/18/2020 1000 Last data filed at 01/17/2020 2159 Gross per 24 hour  Intake 763.3 ml  Output --  Net 763.3 ml   Filed Weights   01/16/20 1744  Weight: 113.4 kg    Physical Examination:  General exam: Appears calm and comfortable on 2 L O2 Respiratory system: Clear to  auscultation. Respiratory effort slightly increased while talking full sentences Cardiovascular system: S1 & S2 heard, RRR. No JVD, murmurs. No pedal edema. Gastrointestinal system: Abdomen is nondistended, soft and nontender. Normal bowel sounds heard. Central nervous system: Alert and oriented. No new focal neurological deficits. Extremities: No contractures, edema or joint deformities.  Skin: No rashes, lesions or ulcers Psychiatry: Judgement and insight appear normal. Mood & affect appropriate.   Data Reviewed: I have personally reviewed following labs and imaging studies  CBC: Recent Labs  Lab 01/12/20 0913 01/16/20 1334 01/16/20 1930 01/17/20 0356 01/18/20 0218  WBC 3.8* 5.5 4.7 3.2* 4.8  NEUTROABS 2.8 3.9  --  2.1 3.1  HGB 13.2 13.4 12.6 12.1 12.0  HCT 41.4 41.4 39.1 37.9 37.7  MCV 88.3 88.8 87.7 89.0 89.5  PLT 183 239 230 235 [id number]   Basic Metabolic Panel: Recent Labs  Lab 01/12/20 0913 01/16/20 1334 01/16/20 1930 01/17/20 0356 01/18/20 0218  NA 140 140  --   --   --   K 2.8* 3.0*  --   --   --   CL 105 103  --   --   --   CO2 26 27  --   --   --   GLUCOSE 106* 94  --   --   --   BUN 9 11  --   --   --   CREATININE 0.63 0.65 0.47  --   --   CALCIUM  8.4* 8.4*  --   --   --   MG  --   --   --  2.2 2.2   GFR: Estimated Creatinine Clearance: 103.9 mL/min (by C-G formula based on SCr of 0.47 mg/dL). Liver Function Tests: Recent Labs  Lab 01/12/20 0913 01/16/20 1334  AST 50* 62*  ALT 67* 73*  ALKPHOS 59 62  BILITOT 0.3 0.7  PROT 7.3 7.3  ALBUMIN 3.8 3.8   No results for input(s): LIPASE, AMYLASE in the last 168 hours. No results for input(s): AMMONIA in the last 168 hours. Coagulation Profile: No results for input(s): INR, PROTIME in the last 168 hours. Cardiac Enzymes: No results for input(s): CKTOTAL, CKMB, CKMBINDEX, TROPONINI in the last 168 hours. BNP (last 3 results) No results for input(s): PROBNP in the last 8760 hours. HbA1C: No results for  input(s): HGBA1C in the last 72 hours. CBG: No results for input(s): GLUCAP in the last 168 hours. Lipid Profile: Recent Labs    01/16/20 1334  TRIG 117   Thyroid Function Tests: No results for input(s): TSH, T4TOTAL, FREET4, T3FREE, THYROIDAB in the last 72 hours. Anemia Panel: Recent Labs    01/17/20 0356 01/18/20 0218  FERRITIN 120 105   Sepsis Labs: Recent Labs  Lab 01/12/20 0913 01/16/20 1334 01/16/20 1931  PROCALCITON <0.10 <0.10  --   LATICACIDVEN 0.8 1.0 1.2    Recent Results (from the past 240 hour(s))  Blood Culture (routine x 2)     Status: None   Collection Time: 01/12/20  9:18 AM   Specimen: BLOOD  Result Value Ref Range Status   Specimen Description   Final    BLOOD LEFT ANTECUBITAL Performed at [facility], [place] [address] [place], [place] [address]    Special Requests   Final    BOTTLES DRAWN AEROBIC AND ANAEROBIC Blood Culture adequate volume Performed at [facility] [address] [place], [place] [address]    Culture   Final    NO GROWTH 5 DAYS Performed at [facility], [place] [address], [place], [place] [address]    Report Status 01/17/2020 FINAL  Final  Blood Culture (routine x 2)     Status: None (Preliminary result)   Collection Time: 01/16/20  1:34 PM   Specimen: BLOOD  Result Value Ref Range Status   Specimen Description   Final    BLOOD RIGHT ANTECUBITAL Performed at [place] [address] [place], [place] [address]    Special Requests   Final    BOTTLES DRAWN AEROBIC AND ANAEROBIC Blood Culture adequate volume Performed at [facility] [address] [place], [place] [address]    Culture   Final    NO GROWTH < 24 HOURS Performed at [facility] [address] [place], [place] [address]    Report Status PENDING  Incomplete  Blood Culture (routine x 2)     Status: None (Preliminary result)   Collection Time: 01/16/20  1:39 PM    Specimen: BLOOD  Result Value Ref Range Status   Specimen Description   Final    BLOOD LEFT ANTECUBITAL Performed at [facility] [address] [place], [place] [address]    Special Requests   Final    BOTTLES DRAWN AEROBIC AND ANAEROBIC Blood Culture adequate volume Performed at [place] [address] [place], [place] [address]    Culture   Final  NO GROWTH < 24 HOURS Performed at [facility] [address], [place], [place] [address]    Report Status PENDING  Incomplete      Radiology Studies: DG Chest Port 1 View  Result Date: 01/16/2020 CLINICAL DATA:  COVID, shortness of breath EXAM: PORTABLE CHEST 1 VIEW COMPARISON:  01/12/2020 FINDINGS: The heart size and mediastinal contours are within normal limits. Subtle heterogeneous airspace opacity of the lung bases, similar to prior examination. The visualized skeletal structures are unremarkable. IMPRESSION: Subtle heterogeneous airspace opacity of the lung bases, similar to prior examination and generally in keeping with COVID infection. No new airspace opacity. Electronically Signed   By: Eddie Candle M.D.   On: 01/16/2020 13:48        Scheduled Meds: . vitamin C  500 mg Oral Daily  . aspirin  325 mg Oral Daily  . buPROPion  300 mg Oral Daily  . clopidogrel  75 mg Oral Daily  . dexamethasone (DECADRON) injection  6 mg Intravenous Q24H  . enoxaparin (LOVENOX) injection  40 mg Subcutaneous Q24H  . Ipratropium-Albuterol  1 puff Inhalation Q6H  . pantoprazole  80 mg Oral Daily  . sertraline  100 mg Oral Daily  . zinc sulfate  220 mg Oral Daily   Continuous Infusions: . remdesivir 100 mg in NS 100 mL Stopped (01/17/20 1130)    Assessment & Plan:   Acute hypoxic respiratory failure secondary to acute Covid-19 viral pneumonia - POA Patient presenting with progressive shortness of breath, positive Covid-19 on 01/07/2020.  Noted to be hypoxic on ambulation with SPO2 85%.   Elevated inflammatory markers and chest x-ray consistent with viral pneumonic process. Started on Decadron 6 mg IV daily as well as Remdesivir (day 3), plan 5-day course. Cont Combivent MDI every 6 hours,vitamin C, zinc . Continue supplemental oxygen, titrate to maintain SPO2 greater than 92%.  CRP downtrending , D dimer stable at 0.6.Continue airborne/contact isolation precautions  Elevated LFTs-AST 62, ALT 73.  Etiology likely secondary to Covid-19 viral process. Repeat labs today  Hypokalemia-Potassium 3.0, repleted with 10 mEq KCl IV and K-Dur 40 mEq p.o. Mag >2. Repeat labs today  History of venous sinus stenosis status post stenting-Continue aspirin and Plavix  Depression: Continue Wellbutrin/sertraline  Morbid obesity-BMI 42.91.  Discussed with patient needs for aggressive lifestyle changes/weight loss as this complicates all facets of care.    DVT prophylaxis: Lovenox Code Status: Full code Family / Patient Communication: Discussed with patient and answered all questions to satisfaction Disposition Plan: Home when medically cleared--completes IV remdesivir therapy and hopefully off O2-1/29     LOS: 2 days    Time spent: 35 minutes    [place] Shi, MD [facility] Pager [phone]  If 7PM-7AM, please contact night-coverage [email or web address] Password [email or web address] 01/18/2020, 10:00 AM

## 2020-01-19 LAB — CBC WITH DIFFERENTIAL/PLATELET
Abs Immature Granulocytes: 0.03 10*3/uL (ref 0.00–0.07)
Basophils Absolute: 0 10*3/uL (ref 0.0–0.1)
Basophils Relative: 1 %
Eosinophils Absolute: 0 10*3/uL (ref 0.0–0.5)
Eosinophils Relative: 0 %
HCT: 35.3 % — ABNORMAL LOW (ref 36.0–46.0)
Hemoglobin: 11 g/dL — ABNORMAL LOW (ref 12.0–15.0)
Immature Granulocytes: 1 %
Lymphocytes Relative: 32 %
Lymphs Abs: 1.9 10*3/uL (ref 0.7–4.0)
MCH: 27.8 pg (ref 26.0–34.0)
MCHC: 31.2 g/dL (ref 30.0–36.0)
MCV: 89.1 fL (ref 80.0–100.0)
Monocytes Absolute: 0.5 10*3/uL (ref 0.1–1.0)
Monocytes Relative: 8 %
Neutro Abs: 3.6 10*3/uL (ref 1.7–7.7)
Neutrophils Relative %: 58 %
Platelets: 327 10*3/uL (ref 150–400)
RBC: 3.96 MIL/uL (ref 3.87–5.11)
RDW: 14.1 % (ref 11.5–15.5)
WBC: 6.1 10*3/uL (ref 4.0–10.5)
nRBC: 0 % (ref 0.0–0.2)

## 2020-01-19 LAB — MAGNESIUM: Magnesium: 2 mg/dL (ref 1.7–2.4)

## 2020-01-19 LAB — FERRITIN: Ferritin: 76 ng/mL (ref 11–307)

## 2020-01-19 LAB — C-REACTIVE PROTEIN: CRP: 0.7 mg/dL (ref <1.0)

## 2020-01-19 LAB — D-DIMER, QUANTITATIVE: D-Dimer, Quant: 0.57 ug/mL-FEU — ABNORMAL HIGH (ref 0.00–0.50)

## 2020-01-19 MED ORDER — POTASSIUM CHLORIDE 20 MEQ PO PACK
40.0000 meq | PACK | Freq: Once | ORAL | Status: AC
  Administered 2020-01-19: 40 meq via ORAL
  Filled 2020-01-19: qty 2

## 2020-01-19 MED ORDER — POTASSIUM CHLORIDE 10 MEQ/100ML IV SOLN
INTRAVENOUS | Status: AC
  Filled 2020-01-19: qty 100

## 2020-01-19 MED ORDER — POTASSIUM CHLORIDE 10 MEQ/100ML IV SOLN
10.0000 meq | INTRAVENOUS | Status: AC
  Administered 2020-01-19: 10 meq via INTRAVENOUS
  Filled 2020-01-19: qty 100

## 2020-01-19 NOTE — Plan of Care (Signed)
  Problem: Education: Goal: Knowledge of General Education information will improve Description: Including pain rating scale, medication(s)/side effects and non-pharmacologic comfort measures Outcome: Progressing   Problem: Clinical Measurements: Goal: Respiratory complications will improve Outcome: Progressing Goal: Cardiovascular complication will be avoided Outcome: Progressing   Problem: Activity: Goal: Risk for activity intolerance will decrease Outcome: Progressing   Problem: Elimination: Goal: Will not experience complications related to bowel motility Outcome: Progressing Goal: Will not experience complications related to urinary retention Outcome: Progressing   Problem: Safety: Goal: Ability to remain free from injury will improve Outcome: Progressing   Problem: Education: Goal: Knowledge of risk factors and measures for prevention of condition will improve Outcome: Progressing   Problem: Respiratory: Goal: Will maintain a patent airway Outcome: Progressing Goal: Complications related to the disease process, condition or treatment will be avoided or minimized Outcome: Progressing

## 2020-01-19 NOTE — Progress Notes (Signed)
PROGRESS NOTE    Stephanie [name]  [id number]  DOB: 08/19/1971  PCP: [name], MD Admit date:01/16/2020  49 y.o. 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  Patient tested positive for Covid-19 on 01/07/2020.  Since then, has had progressive shortness of breath, especially with exertion/ambulation.  Patient has also had associated diarrhea,loss of taste/smell, which is slightly improved.   ED Course: Afebrile,RR 20, BP 121/80, SPO2 96% on 2 L nasal cannula, was 85% on ambulation.  WBC count 5.8, hemoglobin 13.4, platelets 239, sodium 140, potassium 3.0, chloride 103, CO2 27, BUN 11, creatinine 0.65, glucose 94.  AST 62, ALT 73, LDH 22, ferritin 145, CRP 4.4, lactic acid 1.0, procalcitonin less than 0.10.  hCG less than 5.0, D-dimer 0.66.  Chest x-ray with subtle airspace opacities bilateral bases.  Blood cultures x2: Pending.  Patient was started on IV Decadron and remdesivir in the ED Hospital course: Patient admitted to [facility] for COVID 19 pneumonia and associated hypoxia on exertion.  Subjective:  Patient appears to be improving clinically every day.  She reports improvement in dyspnea and her O2 requirement down to 1 L today.  Objective: Vitals:   01/19/20 0220 01/19/20 0611 01/19/20 0648 01/19/20 1337  BP:  140/78  130/83  Pulse:  60  82  Resp:  18  16  Temp:  98.1 F (36.7 C)  98.1 F (36.7 C)  TempSrc:  Oral  Oral  SpO2: 97% 98% 96% 96%  Weight:      Height:        Intake/Output Summary (Last 24 hours) at 01/19/2020 1506 Last data filed at 01/19/2020 0834 Gross per 24 hour  Intake 700 ml  Output --  Net 700 ml   Filed Weights   01/16/20 1744  Weight: 113.4 kg    Physical Examination:  General exam: Appears calm and comfortable on 1 L O2 Respiratory system: Clear to auscultation. Respiratory effort improved while talking  full sentences Cardiovascular system: S1 & S2 heard, RRR. No JVD, murmurs. No pedal edema. Gastrointestinal system: Abdomen is nondistended, soft and nontender. Normal bowel sounds heard. Central nervous system: Alert and oriented. No new focal neurological deficits. Extremities: No contractures, edema or joint deformities.  Skin: No rashes, lesions or ulcers Psychiatry: Judgement and insight appear normal. Mood & affect appropriate.   Data Reviewed: I have personally reviewed following labs and imaging studies  CBC: Recent Labs  Lab 01/16/20 1334 01/16/20 1930 01/17/20 0356 01/18/20 0218 01/19/20 0401  WBC 5.5 4.7 3.2* 4.8 6.1  NEUTROABS 3.9  --  2.1 3.1 3.6  HGB 13.4 12.6 12.1 12.0 11.0*  HCT 41.4 39.1 37.9 37.7 35.3*  MCV 88.8 87.7 89.0 89.5 89.1  PLT 239 230 235 306 [id number]   Basic Metabolic Panel: Recent Labs  Lab 01/16/20 1334 01/16/20 1930 01/17/20 0356 01/18/20 0218 01/18/20 1033 01/19/20 0401  NA 140  --   --   --  145  --   K 3.0*  --   --   --  2.9*  --   CL 103  --   --   --  108  --   CO2 27  --   --   --  26  --   GLUCOSE 94  --   --   --  108*  --   BUN 11  --   --   --  14  --   CREATININE 0.65 0.47  --   --  0.67  --   CALCIUM 8.4*  --   --   --  8.4*  --   MG  --   --  2.2 2.2  --  2.0   GFR: Estimated Creatinine Clearance: 103.9 mL/min (by C-G formula based on SCr of 0.67 mg/dL). Liver Function Tests: Recent Labs  Lab 01/16/20 1334 01/18/20 1033  AST 62* 31  ALT 73* 56*  ALKPHOS 62 58  BILITOT 0.7 0.5  PROT 7.3 6.9  ALBUMIN 3.8 3.6   No results for input(s): LIPASE, AMYLASE in the last 168 hours. No results for input(s): AMMONIA in the last 168 hours. Coagulation Profile: No results for input(s): INR, PROTIME in the last 168 hours. Cardiac Enzymes: No results for input(s): CKTOTAL, CKMB, CKMBINDEX, TROPONINI in the last 168 hours. BNP (last 3 results) No results for input(s): PROBNP in the last 8760 hours. HbA1C: No results for  input(s): HGBA1C in the last 72 hours. CBG: No results for input(s): GLUCAP in the last 168 hours. Lipid Profile: No results for input(s): CHOL, HDL, LDLCALC, TRIG, CHOLHDL, LDLDIRECT in the last 72 hours. Thyroid Function Tests: No results for input(s): TSH, T4TOTAL, FREET4, T3FREE, THYROIDAB in the last 72 hours. Anemia Panel: Recent Labs    01/18/20 0218 01/19/20 0401  FERRITIN 105 76   Sepsis Labs: Recent Labs  Lab 01/16/20 1334 01/16/20 1931  PROCALCITON <0.10  --   LATICACIDVEN 1.0 1.2    Recent Results (from the past 240 hour(s))  Blood Culture (routine x 2)     Status: None   Collection Time: 01/12/20  9:18 AM   Specimen: BLOOD  Result Value Ref Range Status   Specimen Description   Final    BLOOD LEFT ANTECUBITAL Performed at [facility] [address], [place], [place] [address]    Special Requests   Final    BOTTLES DRAWN AEROBIC AND ANAEROBIC Blood Culture adequate volume Performed at [facility] [address] [place], [place] [address]    Culture   Final    NO GROWTH 5 DAYS Performed at [facility], [place] [address] [place] [address] [place], [place] [address]    Report Status 01/17/2020 FINAL  Final  Blood Culture (routine x 2)     Status: None (Preliminary result)   Collection Time: 01/16/20  1:34 PM   Specimen: BLOOD  Result Value Ref Range Status   Specimen Description   Final    BLOOD RIGHT ANTECUBITAL Performed at [facility] [address] [place], [place] [address]    Special Requests   Final    BOTTLES DRAWN AEROBIC AND ANAEROBIC Blood Culture adequate volume Performed at [facility] [address], [place], [place] [address]    Culture   Final    NO GROWTH 3 DAYS Performed at [facility], [place] [address], [place], [place] [address]    Report Status PENDING  Incomplete  Blood Culture (routine x 2)     Status: None (Preliminary result)   Collection Time:  01/16/20  1:39 PM   Specimen: BLOOD  Result Value Ref Range Status   Specimen Description   Final    BLOOD LEFT ANTECUBITAL Performed at [facility] [address], [place], [place] [address]    Special Requests   Final    BOTTLES DRAWN AEROBIC AND ANAEROBIC Blood Culture adequate volume Performed  at [facility], [place] [address] [place], [place] [address]    Culture   Final    NO GROWTH 3 DAYS Performed at [facility], [place] [address], [place], [place] [address]    Report Status PENDING  Incomplete      Radiology Studies: No results found.      Scheduled Meds: . vitamin C  500 mg Oral Daily  . aspirin  325 mg Oral Daily  . buPROPion  300 mg Oral Daily  . clopidogrel  75 mg Oral Daily  . dexamethasone (DECADRON) injection  6 mg Intravenous Q24H  . enoxaparin (LOVENOX) injection  40 mg Subcutaneous Q24H  . Ipratropium-Albuterol  1 puff Inhalation Q6H  . pantoprazole  80 mg Oral Daily  . sertraline  100 mg Oral Daily  . zinc sulfate  220 mg Oral Daily   Continuous Infusions: . remdesivir 100 mg in NS 100 mL 100 mg (01/19/20 0917)    Assessment & Plan:   Acute hypoxic respiratory failure secondary to acute Covid-19 viral pneumonia - POA Patient presented with progressive shortness of breath, positive Covid-19 on 01/07/2020.  Noted to be hypoxic on ambulation with SPO2 85%.  Elevated inflammatory markers and chest x-ray consistent with viral pneumonic process. Started on Decadron 6 mg IV daily as well as Remdesivir (day 4), plan 5-day course. Cont Combivent MDI every 6 hours,vitamin C, zinc . Continue to titrate supplemental oxygen to off. CRP downtrending , D dimer stable at 0.6.Continue airborne/contact isolation precautions  Elevated LFTs-AST 62, ALT 73.  Etiology likely secondary to Covid-19 viral process. Repeat labs today  Hypokalemia-Potassium 3.0, repleted with 10 mEq KCl IV and K-Dur 40 mEq p.o. Mag >2. Repeat labs  still show potassium at 2.9.  Will replete p.o./IV today and repeat levels in a.m.  History of venous sinus stenosis status post stenting-Continue aspirin and Plavix  Depression: Continue Wellbutrin/sertraline  Morbid obesity-BMI 42.91.  Discussed with patient needs for aggressive lifestyle changes/weight loss as this complicates all facets of care.    DVT prophylaxis: Lovenox Code Status: Full code Family / Patient Communication: Discussed with patient and answered all questions to satisfaction Disposition Plan: Home when medically cleared--completes IV remdesivir therapy in a.m. and hopefully off O2-1/29     LOS: 3 days    Time spent: 25 minutes    [place] Shi, MD [facility] Pager [phone]  If 7PM-7AM, please contact night-coverage [email or web address] Password [email or web address] 01/19/2020, 3:06 PM

## 2020-01-19 NOTE — Progress Notes (Signed)
Patient's O2 sat 95% on 2L [place]. RN decreased O2 to 1.5L [place]. RN Will continue to monitor the patient's O2 sat.

## 2020-01-20 DIAGNOSIS — J9600 Acute respiratory failure, unspecified whether with hypoxia or hypercapnia: Secondary | ICD-10-CM

## 2020-01-20 LAB — BASIC METABOLIC PANEL
Anion gap: 9 (ref 5–15)
BUN: 14 mg/dL (ref 6–20)
CO2: 25 mmol/L (ref 22–32)
Calcium: 8.4 mg/dL — ABNORMAL LOW (ref 8.9–10.3)
Chloride: 106 mmol/L (ref 98–111)
Creatinine, Ser: 0.62 mg/dL (ref 0.44–1.00)
GFR calc Af Amer: >60 mL/min (ref >60)
GFR calc non Af Amer: >60 mL/min (ref >60)
Glucose, Bld: 122 mg/dL — ABNORMAL HIGH (ref 70–99)
Potassium: 3.5 mmol/L (ref 3.5–5.1)
Sodium: 140 mmol/L (ref 135–145)

## 2020-01-20 LAB — CBC WITH DIFFERENTIAL/PLATELET
Abs Immature Granulocytes: 0.07 10*3/uL (ref 0.00–0.07)
Basophils Absolute: 0 10*3/uL (ref 0.0–0.1)
Basophils Relative: 1 %
Eosinophils Absolute: 0 10*3/uL (ref 0.0–0.5)
Eosinophils Relative: 0 %
HCT: 40 % (ref 36.0–46.0)
Hemoglobin: 12.2 g/dL (ref 12.0–15.0)
Immature Granulocytes: 1 %
Lymphocytes Relative: 21 %
Lymphs Abs: 1.8 10*3/uL (ref 0.7–4.0)
MCH: 28.1 pg (ref 26.0–34.0)
MCHC: 30.5 g/dL (ref 30.0–36.0)
MCV: 92.2 fL (ref 80.0–100.0)
Monocytes Absolute: 0.6 10*3/uL (ref 0.1–1.0)
Monocytes Relative: 7 %
Neutro Abs: 6 10*3/uL (ref 1.7–7.7)
Neutrophils Relative %: 70 %
Platelets: 382 10*3/uL (ref 150–400)
RBC: 4.34 MIL/uL (ref 3.87–5.11)
RDW: 14.1 % (ref 11.5–15.5)
WBC: 8.5 10*3/uL (ref 4.0–10.5)
nRBC: 0 % (ref 0.0–0.2)

## 2020-01-20 LAB — MAGNESIUM: Magnesium: 2.1 mg/dL (ref 1.7–2.4)

## 2020-01-20 LAB — FERRITIN: Ferritin: 77 ng/mL (ref 11–307)

## 2020-01-20 LAB — D-DIMER, QUANTITATIVE: D-Dimer, Quant: 0.59 ug/mL-FEU — ABNORMAL HIGH (ref 0.00–0.50)

## 2020-01-20 LAB — C-REACTIVE PROTEIN: CRP: 0.7 mg/dL (ref <1.0)

## 2020-01-20 MED ORDER — DEXAMETHASONE 6 MG PO TABS: 6.0000 mg | ORAL_TABLET | Freq: Every day | ORAL | 0 refills | Status: AC

## 2020-01-20 MED ORDER — IPRATROPIUM-ALBUTEROL 20-100 MCG/ACT IN AERS
1.0000 | INHALATION_SPRAY | Freq: Three times a day (TID) | RESPIRATORY_TRACT | Status: DC
  Administered 2020-01-20 (×2): 1 via RESPIRATORY_TRACT

## 2020-01-20 MED ORDER — ALBUTEROL SULFATE HFA 108 (90 BASE) MCG/ACT IN AERS: 2.0000 | INHALATION_SPRAY | Freq: Four times a day (QID) | RESPIRATORY_TRACT | 0 refills | Status: AC | PRN

## 2020-01-20 NOTE — Discharge Summary (Signed)
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 Patient tested positive for Covid-19 on 01/07/2020. Since then, has had progressive shortness of breath, especially with exertion/ambulation. Patient has also had associated diarrhea,loss of taste/smell, which is slightly improved.  ED Course: Afebrile,RR 20, BP 121/80, SPO2 96% on 2 L nasal cannula, was 85% on ambulation. WBC count 5.8, hemoglobin 13.4, platelets 239, sodium 140, potassium 3.0, chloride 103, CO2 27, BUN 11, creatinine 0.65, glucose 94. AST 62, ALT 73, LDH 22, ferritin 145, CRP 4.4, lactic acid 1.0, procalcitonin less than 0.10. hCG less than 5.0, D-dimer 0.66. Chest x-ray with subtle airspace opacities bilateral bases. Blood cultures sent ( no growth). Patient was started on IV Decadron and remdesivir in the ED Hospital course: Patient admitted to [facility] for COVID 19 pneumonia and associated hypoxia on exertion.  Acute hypoxic respiratory failure secondary to acute Covid-19 viral pneumonia - POA Patient presented with progressive shortness of breath, positive Covid-19 on 01/07/2020. Noted to be hypoxic on ambulation with SPO2 85%. Elevated inflammatory markers and chest x-ray consistent with viral pneumonic process. Started on Decadron 6 mgIVdaily as well as Remdesivir (day 5 today). She was also treated with  MDI every 6 hours,vitamin C, zinc . Supplemental oxygen was titrated to off as tolerated prior to discharge. CRP downtrended , D dimer stable at 0.6. She was maintained on airborne/contact isolation precautions and she completed 5 days course of Remdesevir today. She may be discharged with oral decadron to complete 10 day course.  Mildly elevated LFTs-AST 62, ALT 73 on admission.Etiology likely secondary to Covid-19 viral process. Repeat labs now nearly normalized.  Hypokalemia-Potassium 3.0, repleted IV/PO on admission. Mag >2. Repeat labs on 1/27 still showed potassium at 2.9.  Repleted again p.o./IV again on 1/28 and levels normalized now.  History of venous sinus stenosis status post stenting-Continue aspirin and Plavix  Depression:Continue Wellbutrin/sertraline  Morbid obesity-BMI 42.91. Discussed with patient needs for aggressive lifestyle changes/weight loss as this complicates all facets of care.    Discharge Exam:  Vitals:   01/19/20 2206 01/20/20 0538  BP: (!) 157/87 137/79  Pulse: 67 63  Resp: 18 20  Temp: 98 F (36.7 C) 98.2 F (36.8 C)  SpO2: 97% 99%   Vitals:   01/19/20 0648 01/19/20 1337 01/19/20 2206 01/20/20 0538  BP:  130/83 (!) 157/87 137/79  Pulse:  82 67 63  Resp:  16 18 20   Temp:  98.1 F (36.7 C) 98 F (36.7 C) 98.2 F (36.8 C)  TempSrc:  Oral Oral Oral  SpO2: 96% 96% 97% 99%  Weight:      Height:        General: Pt is alert, awake, not in acute distress Cardiovascular: RRR, S1/S2 +, no rubs, no gallops Respiratory: CTA bilaterally, no wheezing, no rhonchi Abdominal: Soft, NT, ND, bowel sounds + Extremities: no edema, no cyanosis  Discharge Condition:Stable CODE STATUS: Full code Diet recommendation: heart healthy diet Recommendations for Outpatient Follow-up:  1.  Follow up with PCP: 1 week 2. Follow up with consultants:  3. Please obtain follow up labs including: Potassium level in 5 days  Home Health services upon discharge:  none Equipment/Devices upon discharge: none   Discharge Instructions:   Allergies as of 01/20/2020      Reactions   Morphine And Related Itching, Nausea And Vomiting      Medication List    STOP taking these medications   levofloxacin 500 MG tablet Commonly known as: LEVAQUIN     TAKE these medications   acetaminophen 325 MG tablet Commonly known as: TYLENOL Take 650 mg by mouth every 6 (six) hours as needed for mild pain or headache.   albuterol 108 (90 Base) MCG/ACT inhaler Commonly known as: VENTOLIN HFA Inhale 2 puffs into the lungs every 6 (six) hours as needed for wheezing or shortness of breath.   aspirin 325 MG tablet Take 1 tablet (325 mg total) by mouth daily.   buPROPion 300 MG 24 hr tablet Commonly known as: WELLBUTRIN XL Take 300 mg by mouth daily.   clopidogrel 75 MG tablet Commonly known as: Plavix Take 1 tablet (75 mg total) by mouth daily.   dexamethasone 6 MG tablet Commonly known as: DECADRON Take 1 tablet (6 mg total) by mouth daily for 6 days.   dextromethorphan-guaiFENesin 30-600 MG 12hr tablet Commonly known as: MUCINEX DM Take 1 tablet by mouth 2 (two) times daily as needed for cough.   fluticasone 50 MCG/ACT nasal spray Commonly known as: FLONASE Place 1 spray into both nostrils daily as needed for allergies or rhinitis.   omeprazole 40 MG capsule Commonly known as: PRILOSEC Take 40 mg by mouth daily.   sertraline 100 MG tablet Commonly known as: ZOLOFT Take 100 mg by mouth daily.   VITAMIN D PO Take 1 capsule by mouth daily.       Allergies  Allergen Reactions  . Morphine And Related Itching and Nausea And Vomiting      The results of significant diagnostics from this hospitalization (including imaging, microbiology, ancillary and laboratory) are listed below for reference.    Labs: BNP (last 3 results) No results for input(s): BNP in the last 8760 hours. Basic Metabolic Panel: Recent Labs  Lab 01/16/20 1334  01/16/20 1930 01/17/20 0356 01/18/20 0218 01/18/20 1033 01/19/20 0401 01/20/20 0202  NA 140  --   --   --  145  --  140  K 3.0*  --   --   --  2.9*  --  3.5  CL 103  --   --   --  108  --  106  CO2 27  --   --   --  26  --  25  GLUCOSE 94  --   --   --  108*  --  122*  BUN 11  --   --   --  14  --  14  CREATININE 0.65 0.47  --   --  0.67  --  0.62  CALCIUM 8.4*  --   --   --  8.4*  --  8.4*  MG  --   --  2.2 2.2  --  2.0 2.1   Liver Function Tests: Recent Labs  Lab 01/16/20 1334 01/18/20 1033  AST 62* 31  ALT 73* 56*  ALKPHOS 62 58  BILITOT 0.7 0.5  PROT 7.3 6.9  ALBUMIN 3.8 3.6   No results for input(s): LIPASE, AMYLASE in the last 168 hours. No  results for input(s): AMMONIA in the last 168 hours. CBC: Recent Labs  Lab 01/16/20 1334 01/16/20 1334 01/16/20 1930 01/17/20 0356 01/18/20 0218 01/19/20 0401 01/20/20 0202  WBC 5.5   < > 4.7 3.2* 4.8 6.1 8.5  NEUTROABS 3.9  --   --  2.1 3.1 3.6 6.0  HGB 13.4   < > 12.6 12.1 12.0 11.0* 12.2  HCT 41.4   < > 39.1 37.9 37.7 35.3* 40.0  MCV 88.8   < > 87.7 89.0 89.5 89.1 92.2  PLT 239   < > 230 235 306 327 382   < > = values in this interval not displayed.   Cardiac Enzymes: No results for input(s): CKTOTAL, CKMB, CKMBINDEX, TROPONINI in the last 168 hours. BNP: Invalid input(s): POCBNP CBG: No results for input(s): GLUCAP in the last 168 hours. D-Dimer Recent Labs    01/19/20 0401 01/20/20 0202  DDIMER 0.57* 0.59*   Hgb A1c No results for input(s): HGBA1C in the last 72 hours. Lipid Profile No results for input(s): CHOL, HDL, LDLCALC, TRIG, CHOLHDL, LDLDIRECT in the last 72 hours. Thyroid function studies No results for input(s): TSH, T4TOTAL, T3FREE, THYROIDAB in the last 72 hours.  Invalid input(s): FREET3 Anemia work up Recent Labs    01/19/20 0401 01/20/20 0202  FERRITIN 76 77   Urinalysis    Component Value Date/Time   COLORURINE YELLOW 12/12/2019 [facility] 12/12/2019 0711    LABSPEC 1.021 12/12/2019 0711   PHURINE 5.0 12/12/2019 0711   GLUCOSEU NEGATIVE 12/12/2019 0711   GLUCOSEU NEGATIVE 05/08/2009 1153   HGBUR SMALL (A) 12/12/2019 0711   BILIRUBINUR NEGATIVE 12/12/2019 0711   KETONESUR NEGATIVE 12/12/2019 0711   PROTEINUR NEGATIVE 12/12/2019 0711   UROBILINOGEN 0.2 05/08/2009 1153   NITRITE NEGATIVE 12/12/2019 0711   LEUKOCYTESUR NEGATIVE 12/12/2019 0711   Sepsis Labs Invalid input(s): PROCALCITONIN,  WBC,  LACTICIDVEN Microbiology Recent Results (from the past 240 hour(s))  Blood Culture (routine x 2)     Status: None   Collection Time: 01/12/20  9:18 AM   Specimen: BLOOD  Result Value Ref Range Status   Specimen Description   Final    BLOOD LEFT ANTECUBITAL Performed at [facility], [place] [address], [place], [place] [address]    Special Requests   Final    BOTTLES DRAWN AEROBIC AND ANAEROBIC Blood Culture adequate volume Performed at [place] [address] [place], [place] [address]    Culture   Final    NO GROWTH 5 DAYS Performed at [facility], [place] [address] [place], [place] [address]    Report Status 01/17/2020 FINAL  Final  Blood Culture (routine x 2)     Status: None (Preliminary result)   Collection Time: 01/16/20  1:34 PM   Specimen: BLOOD  Result Value Ref Range Status   Specimen Description   Final    BLOOD RIGHT ANTECUBITAL Performed at [place] [address], [place], [place] [address]    Special Requests   Final    BOTTLES DRAWN AEROBIC AND ANAEROBIC Blood Culture adequate volume Performed at [place] [address] [place], [place] [address]    Culture   Final    NO GROWTH 4 DAYS Performed at [facility], [place] [address], [place],  [address]    Report Status PENDING  Incomplete  Blood Culture (routine x 2)     Status: None (Preliminary result)  Collection Time: 01/16/20  1:39 PM   Specimen: BLOOD   Result Value Ref Range Status   Specimen Description   Final    BLOOD LEFT ANTECUBITAL Performed at [facility] [place] [address] [place], [place] [address]    Special Requests   Final    BOTTLES DRAWN AEROBIC AND ANAEROBIC Blood Culture adequate volume Performed at [place] [address] [place], [place] [address]    Culture   Final    NO GROWTH 4 DAYS Performed at [facility], [place] [address], [place], [place] [address]    Report Status PENDING  Incomplete    Procedures/Studies: DG Chest Port 1 View  Result Date: 01/16/2020 CLINICAL DATA:  COVID, shortness of breath EXAM: PORTABLE CHEST 1 VIEW COMPARISON:  01/12/2020 FINDINGS: The heart size and mediastinal contours are within normal limits. Subtle heterogeneous airspace opacity of the lung bases, similar to prior examination. The visualized skeletal structures are unremarkable. IMPRESSION: Subtle heterogeneous airspace opacity of the lung bases, similar to prior examination and generally in keeping with COVID infection. No new airspace opacity. Electronically Signed   By: Eddie Candle M.D.   On: 01/16/2020 13:48   DG Chest Port 1 View  Result Date: 01/12/2020 CLINICAL DATA:  Shortness of breath with decreased oxygen saturation. COVID-19 positive EXAM: PORTABLE CHEST 1 VIEW COMPARISON:  January 19, 2012 FINDINGS: There is atelectatic change in the right base. Lungs elsewhere are clear. Heart is upper normal in size with pulmonary vascularity normal. No adenopathy. There is postoperative change in the lower cervical region. IMPRESSION: Right base atelectasis. Question early pneumonia in this area. Lungs elsewhere clear. Heart upper normal in size. No adenopathy. Electronically Signed   By: Lowella Grip III M.D.   On: 01/12/2020 10:37    Time coordinating discharge: Over 30 minutes  SIGNED:   Guilford Shi, MD  [facility] 01/20/2020, 9:18 AM Pager : [phone]

## 2020-01-20 NOTE — Progress Notes (Signed)
AVS given to patient and explained at the bedside. Medications and follow up appointments have been explained with pt verbalizing understanding.  

## 2020-01-20 NOTE — Progress Notes (Signed)
SATURATION QUALIFICATIONS: (This note is used to comply with regulatory documentation for home oxygen)  Patient Saturations on Room Air at Rest = 95%  Patient Saturations on Room Air while Ambulating = 95%  Patient Saturations on 0 Liters of oxygen while Ambulating = 95%  Please briefly explain why patient needs home oxygen: not needed

## 2020-01-21 LAB — CULTURE, BLOOD (ROUTINE X 2)
Culture: NO GROWTH
Culture: NO GROWTH
Special Requests: ADEQUATE
Special Requests: ADEQUATE

## 2020-01-26 DIAGNOSIS — U071 COVID-19: Secondary | ICD-10-CM | POA: Diagnosis not present

## 2020-01-26 DIAGNOSIS — J1282 Pneumonia due to coronavirus disease 2019: Secondary | ICD-10-CM | POA: Diagnosis not present

## 2020-01-30 DIAGNOSIS — J1282 Pneumonia due to coronavirus disease 2019: Secondary | ICD-10-CM | POA: Diagnosis not present

## 2020-01-30 DIAGNOSIS — R7401 Elevation of levels of liver transaminase levels: Secondary | ICD-10-CM | POA: Diagnosis not present

## 2020-02-14 DIAGNOSIS — Z23 Encounter for immunization: Secondary | ICD-10-CM | POA: Diagnosis not present

## 2020-04-24 DIAGNOSIS — Z1231 Encounter for screening mammogram for malignant neoplasm of breast: Secondary | ICD-10-CM | POA: Diagnosis not present

## 2020-04-24 DIAGNOSIS — Z01419 Encounter for gynecological examination (general) (routine) without abnormal findings: Secondary | ICD-10-CM | POA: Diagnosis not present

## 2020-04-24 DIAGNOSIS — Z6841 Body Mass Index (BMI) 40.0 and over, adult: Secondary | ICD-10-CM | POA: Diagnosis not present

## 2020-04-24 DIAGNOSIS — B373 Candidiasis of vulva and vagina: Secondary | ICD-10-CM | POA: Diagnosis not present

## 2020-05-23 ENCOUNTER — Other Ambulatory Visit (HOSPITAL_COMMUNITY): Payer: Self-pay | Admitting: Student

## 2020-05-23 ENCOUNTER — Telehealth (HOSPITAL_COMMUNITY): Payer: Self-pay

## 2020-05-23 ENCOUNTER — Other Ambulatory Visit (HOSPITAL_COMMUNITY): Payer: Self-pay | Admitting: Interventional Radiology

## 2020-05-23 DIAGNOSIS — I771 Stricture of artery: Secondary | ICD-10-CM

## 2020-05-23 MED ORDER — DIAZEPAM 5 MG PO TABS: 5.0000 mg | ORAL_TABLET | Freq: Once | ORAL | 0 refills | Status: AC

## 2020-05-23 NOTE — Telephone Encounter (Signed)
Called to schedule mri/mrv, no answer, left vm. AW  ?

## 2020-06-14 ENCOUNTER — Ambulatory Visit (HOSPITAL_COMMUNITY)
Admission: RE | Admit: 2020-06-14 | Discharge: 2020-06-14 | Disposition: A | Discharge status: Home / Self Care | Payer: BC Managed Care – PPO | Source: Ambulatory Visit | Attending: Interventional Radiology | Admitting: Interventional Radiology

## 2020-06-14 DIAGNOSIS — I771 Stricture of artery: Secondary | ICD-10-CM

## 2020-06-14 DIAGNOSIS — I676 Nonpyogenic thrombosis of intracranial venous system: Secondary | ICD-10-CM | POA: Diagnosis not present

## 2020-06-14 MED ORDER — GADOBUTROL 1 MMOL/ML IV SOLN
10.0000 mL | Freq: Once | INTRAVENOUS | Status: AC | PRN
  Administered 2020-06-14: 10 mL via INTRAVENOUS

## 2020-06-19 ENCOUNTER — Telehealth (HOSPITAL_COMMUNITY): Payer: Self-pay

## 2020-06-19 NOTE — Telephone Encounter (Signed)
Pt agreed to f/u in 6 months with diagnostic angiogram. AW 

## 2020-06-29 DIAGNOSIS — M79672 Pain in left foot: Secondary | ICD-10-CM | POA: Diagnosis not present

## 2020-10-26 DIAGNOSIS — N390 Urinary tract infection, site not specified: Secondary | ICD-10-CM | POA: Diagnosis not present

## 2020-10-26 DIAGNOSIS — N3001 Acute cystitis with hematuria: Secondary | ICD-10-CM | POA: Diagnosis not present

## 2020-11-06 ENCOUNTER — Other Ambulatory Visit (HOSPITAL_COMMUNITY): Payer: Self-pay | Admitting: Interventional Radiology

## 2020-11-06 DIAGNOSIS — H93A9 Pulsatile tinnitus, unspecified ear: Secondary | ICD-10-CM

## 2020-11-22 ENCOUNTER — Other Ambulatory Visit: Payer: Self-pay | Admitting: Radiology

## 2020-11-23 ENCOUNTER — Other Ambulatory Visit (HOSPITAL_COMMUNITY): Payer: Self-pay | Admitting: Interventional Radiology

## 2020-11-23 ENCOUNTER — Ambulatory Visit (HOSPITAL_COMMUNITY)
Admission: RE | Admit: 2020-11-23 | Discharge: 2020-11-23 | Disposition: A | Discharge status: Home / Self Care | Payer: BC Managed Care – PPO | Source: Ambulatory Visit | Attending: Interventional Radiology | Admitting: Interventional Radiology

## 2020-11-23 ENCOUNTER — Other Ambulatory Visit: Payer: Self-pay

## 2020-11-23 ENCOUNTER — Encounter (HOSPITAL_COMMUNITY): Payer: Self-pay

## 2020-11-23 DIAGNOSIS — H9311 Tinnitus, right ear: Secondary | ICD-10-CM | POA: Diagnosis not present

## 2020-11-23 DIAGNOSIS — I676 Nonpyogenic thrombosis of intracranial venous system: Secondary | ICD-10-CM | POA: Diagnosis not present

## 2020-11-23 DIAGNOSIS — Z7982 Long term (current) use of aspirin: Secondary | ICD-10-CM | POA: Diagnosis not present

## 2020-11-23 DIAGNOSIS — I679 Cerebrovascular disease, unspecified: Secondary | ICD-10-CM | POA: Diagnosis not present

## 2020-11-23 DIAGNOSIS — H93A9 Pulsatile tinnitus, unspecified ear: Secondary | ICD-10-CM

## 2020-11-23 DIAGNOSIS — H93A1 Pulsatile tinnitus, right ear: Secondary | ICD-10-CM | POA: Insufficient documentation

## 2020-11-23 DIAGNOSIS — Z79899 Other long term (current) drug therapy: Secondary | ICD-10-CM | POA: Insufficient documentation

## 2020-11-23 HISTORY — PX: IR US GUIDE VASC ACCESS RIGHT: IMG2390

## 2020-11-23 HISTORY — PX: IR ANGIO VERTEBRAL SEL VERTEBRAL UNI R MOD SED: IMG5368

## 2020-11-23 HISTORY — PX: IR ANGIO INTRA EXTRACRAN SEL COM CAROTID INNOMINATE BILAT MOD SED: IMG5360

## 2020-11-23 LAB — APTT: aPTT: 30 seconds (ref 24–36)

## 2020-11-23 LAB — CBC
HCT: 41.7 % (ref 36.0–46.0)
Hemoglobin: 13.2 g/dL (ref 12.0–15.0)
MCH: 28.1 pg (ref 26.0–34.0)
MCHC: 31.7 g/dL (ref 30.0–36.0)
MCV: 88.9 fL (ref 80.0–100.0)
Platelets: 364 10*3/uL (ref 150–400)
RBC: 4.69 MIL/uL (ref 3.87–5.11)
RDW: 14 % (ref 11.5–15.5)
WBC: 6.7 10*3/uL (ref 4.0–10.5)
nRBC: 0 % (ref 0.0–0.2)

## 2020-11-23 LAB — BASIC METABOLIC PANEL
Anion gap: 12 (ref 5–15)
BUN: 14 mg/dL (ref 6–20)
CO2: 22 mmol/L (ref 22–32)
Calcium: 8.9 mg/dL (ref 8.9–10.3)
Chloride: 106 mmol/L (ref 98–111)
Creatinine, Ser: 0.7 mg/dL (ref 0.44–1.00)
GFR, Estimated: >60 mL/min (ref >60)
Glucose, Bld: 74 mg/dL (ref 70–99)
Potassium: 3.9 mmol/L (ref 3.5–5.1)
Sodium: 140 mmol/L (ref 135–145)

## 2020-11-23 LAB — PROTIME-INR
INR: 1 (ref 0.8–1.2)
Prothrombin Time: 13 seconds (ref 11.4–15.2)

## 2020-11-23 MED ORDER — HEPARIN SODIUM (PORCINE) 1000 UNIT/ML IJ SOLN
INTRAMUSCULAR | Status: AC | PRN
  Administered 2020-11-23: 2000 [IU] via INTRA_ARTERIAL

## 2020-11-23 MED ORDER — LIDOCAINE HCL 1 % IJ SOLN
INTRAMUSCULAR | Status: AC
  Filled 2020-11-23: qty 20

## 2020-11-23 MED ORDER — IOHEXOL 300 MG/ML  SOLN
150.0000 mL | Freq: Once | INTRAMUSCULAR | Status: AC | PRN
  Administered 2020-11-23: 60 mL via INTRA_ARTERIAL

## 2020-11-23 MED ORDER — MIDAZOLAM HCL 2 MG/2ML IJ SOLN
INTRAMUSCULAR | Status: AC | PRN
  Administered 2020-11-23: 1 mg via INTRAVENOUS

## 2020-11-23 MED ORDER — MIDAZOLAM HCL 2 MG/2ML IJ SOLN
INTRAMUSCULAR | Status: AC
  Filled 2020-11-23: qty 2

## 2020-11-23 MED ORDER — HEPARIN SODIUM (PORCINE) 1000 UNIT/ML IJ SOLN
INTRAMUSCULAR | Status: AC
  Filled 2020-11-23: qty 1

## 2020-11-23 MED ORDER — SODIUM CHLORIDE 0.9 % IV SOLN: INTRAVENOUS | Status: AC

## 2020-11-23 MED ORDER — FENTANYL CITRATE (PF) 100 MCG/2ML IJ SOLN
INTRAMUSCULAR | Status: AC | PRN
Start: 2020-11-23 — End: 2020-11-23
  Administered 2020-11-23: 25 ug via INTRAVENOUS

## 2020-11-23 MED ORDER — VERAPAMIL HCL 2.5 MG/ML IV SOLN
INTRAVENOUS | Status: AC | PRN
  Administered 2020-11-23: 2.5 mg via INTRAVENOUS

## 2020-11-23 MED ORDER — LIDOCAINE HCL (PF) 1 % IJ SOLN
INTRAMUSCULAR | Status: AC | PRN
  Administered 2020-11-23: 2 mL

## 2020-11-23 MED ORDER — NITROGLYCERIN 1 MG/10 ML FOR IR/CATH LAB
INTRA_ARTERIAL | Status: AC
  Filled 2020-11-23: qty 10

## 2020-11-23 MED ORDER — NITROGLYCERIN 1 MG/10 ML FOR IR/CATH LAB
INTRA_ARTERIAL | Status: AC | PRN
  Administered 2020-11-23: 400 ug via INTRA_ARTERIAL

## 2020-11-23 MED ORDER — SODIUM CHLORIDE 0.9 % IV SOLN: INTRAVENOUS | Status: DC

## 2020-11-23 MED ORDER — VERAPAMIL HCL 2.5 MG/ML IV SOLN
INTRAVENOUS | Status: AC
  Filled 2020-11-23: qty 2

## 2020-11-23 MED ORDER — FENTANYL CITRATE (PF) 100 MCG/2ML IJ SOLN
INTRAMUSCULAR | Status: AC
  Filled 2020-11-23: qty 2

## 2020-11-23 MED ORDER — SODIUM CHLORIDE 0.9 % IV SOLN
INTRAVENOUS | Status: AC | PRN
  Administered 2020-11-23: 250 mL via INTRAVENOUS

## 2020-11-23 NOTE — H&P (Signed)
Chief Complaint: Patient was seen in consultation today for pulsatile tinnitus  Supervising Physician: Luanne Bras  Patient Status: [facility] - Out-pt  History of Present Illness: Stephanie [name] is a 49 y.o. female with past medical history of anxiety, GERD, and pulsatile tinnitus.  Patient has been followed by our office since February 2013 when she was assessed for right ear tinnitus.  She underwent diagnostic angiogram which revealed severe stenosis of the right transverse sinus and sigmoid sinus junction, with hypoplastic left transverse sinus. She was followed with serial imaging for several years before undergoing angioplasty and stenting of dominant right TS/SSS junction severe stenosis 12/11/20. She reports she took Plavix for several months with aspirin 325.  She is currently only taking aspirin 325 mg. Most recent MR showed artifact from the right transverse sinus stent precludes assessment for in-stent stenosis. She presents today for diagnostic angiogram.   Stephanie [name] is assessed at bedside.  She is in her usual state of health.  Denies fever, chills, tinnitus, nausea, vomiting.  Reports occasional headaches exacerbated by stress.  She has been NPO today.   Past Medical History:  Diagnosis Date  . Anxiety   . GERD (gastroesophageal reflux disease)   . Menorrhagia   . Pneumonia   . Wears contact lenses     Past Surgical History:  Procedure Laterality Date  . ANTERIOR CERVICAL DECOMP/DISCECTOMY FUSION  06/ 2015   C4 -- C5  . BACK SURGERY    . CESAREAN SECTION  03-29-2002/  1997/  1994  . CHOLECYSTECTOMY  08/12/2012   Procedure: LAPAROSCOPIC CHOLECYSTECTOMY WITH INTRAOPERATIVE CHOLANGIOGRAM;  Surgeon: Earnstine Regal, MD;  Location: [facility];  Service: General;  Laterality: N/A;  . DILITATION & CURRETTAGE/HYSTROSCOPY WITH NOVASURE ABLATION N/A 10/26/2014   Procedure: DILATATION & CURETTAGE/HYSTEROSCOPY WITH ATTEMPTED NOVASURE ABLATION WITH IUD REMOVAL;  Surgeon: Maisie Fus, MD;  Location: [facility];  Service: Gynecology;  Laterality: N/A;  . INTRAUTERINE DEVICE (IUD) INSERTION  2010  . IR ANGIO INTRA EXTRACRAN SEL COM CAROTID INNOMINATE UNI R MOD SED  12/12/2019  . IR CT HEAD LTD  12/12/2019  . IR RADIOLOGIST EVAL & MGMT  06/22/2018  . IR TRANSCATH PLC STENT 1ST ART NOT LE CV CAR VERT CAR  12/12/2019  . IR VENO/JUGULAR RIGHT  12/12/2019  . LAPAROSCOPIC TUBAL LIGATION Bilateral 10/26/2014   Procedure: LAPAROSCOPIC TUBAL LIGATION;  Surgeon: Maisie Fus, MD;  Location: [facility];  Service: Gynecology;  Laterality: Bilateral;  . LUMBAR LAMINECTOMY/DECOMPRESSION MICRODISCECTOMY  02/10/2012   Procedure: LUMBAR LAMINECTOMY/DECOMPRESSION MICRODISCECTOMY 1 LEVEL;  Surgeon: Charlie Pitter, MD;  Location: [place] [facility];  Service: Neurosurgery;  Laterality: Left;  Left Lumbar Four-Five Laminectomy and Microdiskectomy  . RADIOLOGY WITH ANESTHESIA Bilateral 12/12/2019   Procedure: STENTING;  Surgeon: Luanne Bras, MD;  Location: [place];  Service: Radiology;  Laterality: Bilateral;  . REMOVAL BENIGN THROAT CYST  age 29    Allergies: Morphine and related  Medications: Prior to Admission medications   Medication Sig Start Date End Date Taking? Authorizing Provider  acetaminophen (TYLENOL) 500 MG tablet Take 1,000 mg by mouth every 6 (six) hours as needed for moderate pain or headache.   Yes [provider]  aspirin 325 MG tablet Take 1 tablet (325 mg total) by mouth daily. 12/13/19  Yes Bruning, Lennette Bihari, PA-C  buPROPion (WELLBUTRIN XL) 300 MG 24 hr tablet Take 300 mg by mouth daily.    Yes [provider]  calcium carbonate (TUMS -  DOSED IN MG ELEMENTAL CALCIUM) 500 MG chewable tablet Chew 1,000-1,500 mg by mouth daily as needed for indigestion or heartburn.   Yes [provider]  omeprazole (PRILOSEC) 40 MG capsule Take 40 mg by mouth daily.   Yes [provider]  sertraline (ZOLOFT) 100 MG tablet Take  100 mg by mouth daily.   Yes [provider]  albuterol (VENTOLIN HFA) 108 (90 Base) MCG/ACT inhaler Inhale 2 puffs into the lungs every 6 (six) hours as needed for wheezing or shortness of breath. Patient not taking: Reported on 11/19/2020 01/20/20   Guilford Shi, MD  clopidogrel (PLAVIX) 75 MG tablet Take 1 tablet (75 mg total) by mouth daily. Patient not taking: Reported on 11/19/2020 12/01/19   Ascencion Dike, PA-C     Family History  Problem Relation Age of Onset  . Anesthesia problems Mother     Social History   Socioeconomic History  . Marital status: Married    Spouse name: Not on file  . Number of children: Not on file  . Years of education: Not on file  . Highest education level: Not on file  Occupational History  . Occupation: Programme researcher, broadcasting/film/video: [employer]  Tobacco Use  . Smoking status: Never Smoker  . Smokeless tobacco: Never Used  Vaping Use  . Vaping Use: Never used  Substance and Sexual Activity  . Alcohol use: No  . Drug use: No  . Sexual activity: Yes    Birth control/protection: I.U.D.  Other Topics Concern  . Not on file  Social History Narrative  . Not on file   Social Determinants of Health   Financial Resource Strain:   . Difficulty of Paying Living Expenses: Not on file  Food Insecurity:   . Worried About Charity fundraiser in the Last Year: Not on file  . Ran Out of Food in the Last Year: Not on file  Transportation Needs:   . Lack of Transportation (Medical): Not on file  . Lack of Transportation (Non-Medical): Not on file  Physical Activity:   . Days of Exercise per Week: Not on file  . Minutes of Exercise per Session: Not on file  Stress:   . Feeling of Stress : Not on file  Social Connections:   . Frequency of Communication with Friends and Family: Not on file  . Frequency of Social Gatherings with Friends and Family: Not on file  . Attends Religious Services: Not on file  . Active Member of Clubs or  Organizations: Not on file  . Attends Archivist Meetings: Not on file  . Marital Status: Not on file     Review of Systems: A 12 point ROS discussed and pertinent positives are indicated in the HPI above.  All other systems are negative.  Review of Systems  Constitutional: Negative for fatigue and fever.  HENT: Negative for tinnitus.   Respiratory: Negative for cough and shortness of breath.   Cardiovascular: Negative for chest pain.  Gastrointestinal: Negative for abdominal pain, nausea and vomiting.  Genitourinary: Negative for dysuria.  Musculoskeletal: Negative for back pain.  Neurological: Negative for dizziness, light-headedness and headaches.  Psychiatric/Behavioral: Negative for behavioral problems and confusion.    Vital Signs: BP (!) 151/83   Pulse 79   Temp 98.1 F (36.7 C)   Ht 5\' 4"  (1.626 m)   Wt 270 lb (122.5 kg)   LMP 07/12/2012   SpO2 100%   BMI 46.35 kg/m   Physical  Exam Vitals and nursing note reviewed.  Constitutional:      General: She is not in acute distress.    Appearance: Normal appearance. She is not ill-appearing.  HENT:     Mouth/Throat:     Mouth: Mucous membranes are moist.     Pharynx: Oropharynx is clear.  Cardiovascular:     Rate and Rhythm: Normal rate and regular rhythm.  Pulmonary:     Effort: Pulmonary effort is normal. No respiratory distress.     Breath sounds: Normal breath sounds.  Abdominal:     General: Abdomen is flat.     Palpations: Abdomen is soft.  Skin:    General: Skin is warm and dry.  Neurological:     General: No focal deficit present.     Mental Status: She is alert and oriented to person, place, and time. Mental status is at baseline.  Psychiatric:        Mood and Affect: Mood normal.        Behavior: Behavior normal.        Thought Content: Thought content normal.        Judgment: Judgment normal.      MD Evaluation Airway: WNL Heart: WNL Abdomen: WNL Chest/ Lungs: WNL ASA   Classification: 3 Mallampati/Airway Score: Two   Imaging: No results found.  Labs:  CBC: Recent Labs    01/18/20 0218 01/19/20 0401 01/20/20 0202 11/23/20 0839  WBC 4.8 6.1 8.5 6.7  HGB 12.0 11.0* 12.2 13.2  HCT 37.7 35.3* 40.0 41.7  PLT 306 327 382 364    COAGS: Recent Labs    12/12/19 0633 11/23/20 0839  INR 1.0 1.0  APTT 24 30    BMP: Recent Labs    01/16/20 1334 01/16/20 1334 01/16/20 1930 01/18/20 1033 01/20/20 0202 11/23/20 0839  NA 140  --   --  145 140 140  K 3.0*  --   --  2.9* 3.5 3.9  CL 103  --   --  108 106 106  CO2 27  --   --  26 25 22   GLUCOSE 94  --   --  108* 122* 74  BUN 11  --   --  14 14 14   CALCIUM 8.4*  --   --  8.4* 8.4* 8.9  CREATININE 0.65   < > 0.47 0.67 0.62 0.70  GFRNONAA >60   < > >60 >60 >60 >60  GFRAA >60  --  >60 >60 >60  --    < > = values in this interval not displayed.    LIVER FUNCTION TESTS: Recent Labs    01/12/20 0913 01/16/20 1334 01/18/20 1033  BILITOT 0.3 0.7 0.5  AST 50* 62* 31  ALT 67* 73* 56*  ALKPHOS 59 62 58  PROT 7.3 7.3 6.9  ALBUMIN 3.8 3.8 3.6    TUMOR MARKERS: No results for input(s): AFPTM, CEA, CA199, CHROMGRNA in the last 8760 hours.  Assessment and Plan: Patient with past medical history of pulsatile tinnitus presents for stent evaluation with diagnostic angiogram .  Case reviewed by Dr. Estanislado Pandy who approves patient for procedure.  Patient presents today in their usual state of health.  She has been NPO and is not currently on blood thinners.   Risks and benefits were discussed with the patient including, but not limited to bleeding, infection, vascular injury or contrast induced renal failure.  This interventional procedure involves the use of X-rays and because of the nature of the planned procedure,  it is possible that we will have prolonged use of X-ray fluoroscopy.  Potential radiation risks to you include (but are not limited to) the following: - A slightly elevated risk  for cancer  several years later in life. This risk is typically less than 0.5% percent. This risk is low in comparison to the normal incidence of human cancer, which is 33% for women and 50% for men according to the [employer]. - Radiation induced injury can include skin redness, resembling a rash, tissue breakdown / ulcers and hair loss (which can be temporary or permanent).   The likelihood of either of these occurring depends on the difficulty of the procedure and whether you are sensitive to radiation due to previous procedures, disease, or genetic conditions.   IF your procedure requires a prolonged use of radiation, you will be notified and given written instructions for further action.  It is your responsibility to monitor the irradiated area for the 2 weeks following the procedure and to notify your physician if you are concerned that you have suffered a radiation induced injury.    All of the patient's questions were answered, patient is agreeable to proceed.  Consent signed and in chart.  Thank you for this interesting consult.  I greatly enjoyed meeting Stephanie [name] and look forward to participating in their care.  A copy of this report was sent to the requesting provider on this date.  Electronically Signed: Docia Barrier, PA 11/23/2020, 10:38 AM   I spent a total of  30 Minutes   in face to face in clinical consultation, greater than 50% of which was counseling/coordinating care for tinnitus.

## 2020-11-23 NOTE — Progress Notes (Signed)
Interventional Radiology Brief Note:  S/p angiogram today. Per Dr. Estanislado Pandy, ok to continue with aspirin 325mg  daily.  Plan for MRV Head, MR Brain in 1 year.   Brynda Greathouse, MS RD PA-C 1:13 PM

## 2020-11-23 NOTE — Progress Notes (Signed)
Patient was given discharge instructions. She verbalized understanding. 

## 2020-11-23 NOTE — Discharge Instructions (Addendum)
Radial Site Care  This sheet gives you information about how to care for yourself after your procedure. Your health care provider may also give you more specific instructions. If you have problems or questions, contact your health care provider. What can I expect after the procedure? After the procedure, it is common to have:  Bruising and tenderness at the catheter insertion area. Follow these instructions at home: Medicines  Take over-the-counter and prescription medicines only as told by your health care provider. Insertion site care  Follow instructions from your health care provider about how to take care of your insertion site. Make sure you: ? Wash your hands with soap and water before you change your bandage (dressing). If soap and water are not available, use hand sanitizer. ? Change your dressing as told by your health care provider. ? Leave stitches (sutures), skin glue, or adhesive strips in place. These skin closures may need to stay in place for 2 weeks or longer. If adhesive strip edges start to loosen and curl up, you may trim the loose edges. Do not remove adhesive strips completely unless your health care provider tells you to do that.  Check your insertion site every day for signs of infection. Check for: ? Redness, swelling, or pain. ? Fluid or blood. ? Pus or a bad smell. ? Warmth.  Do not take baths, swim, or use a hot tub until your health care provider approves.  You may shower 24-48 hours after the procedure, or as directed by your health care provider. ? Remove the dressing and gently wash the site with plain soap and water. ? Pat the area dry with a clean towel. ? Do not rub the site. That could cause bleeding.  Do not apply powder or lotion to the site. Activity   For 24 hours after the procedure, or as directed by your health care provider: ? Do not flex or bend the affected arm. ? Do not push or pull heavy objects with the affected arm. ? Do not  drive yourself home from the hospital or clinic. You may drive 24 hours after the procedure unless your health care provider tells you not to. ? Do not operate machinery or power tools.  Do not lift anything that is heavier than 10 lb (4.5 kg), or the limit that you are told, until your health care provider says that it is safe.  Ask your health care provider when it is okay to: ? Return to work or school. ? Resume usual physical activities or sports. ? Resume sexual activity. General instructions  If the catheter site starts to bleed, raise your arm and put firm pressure on the site. If the bleeding does not stop, get help right away. This is a medical emergency.  If you went home on the same day as your procedure, a responsible adult should be with you for the first 24 hours after you arrive home.  Keep all follow-up visits as told by your health care provider. This is important. Contact a health care provider if:  You have a fever.  You have redness, swelling, or yellow drainage around your insertion site. Get help right away if:  You have unusual pain at the radial site.  The catheter insertion area swells very fast.  The insertion area is bleeding, and the bleeding does not stop when you hold steady pressure on the area.  Your arm or hand becomes pale, cool, tingly, or numb. These symptoms may represent a serious problem   that is an emergency. Do not wait to see if the symptoms will go away. Get medical help right away. Call your local emergency services (911 in the [place] Do not drive yourself to the hospital. Summary  After the procedure, it is common to have bruising and tenderness at the site.  Follow instructions from your health care provider about how to take care of your radial site wound. Check the wound every day for signs of infection.  Do not lift anything that is heavier than 10 lb (4.5 kg), or the limit that you are told, until your health care provider says  that it is safe. This information is not intended to replace advice given to you by your health care provider. Make sure you discuss any questions you have with your health care provider. Document Revised: 01/13/2018 Document Reviewed: 01/13/2018 Elsevier Patient Education  2020 [employer] Cerebral Angiogram, Care After This sheet gives you information about how to care for yourself after your procedure. Your health care provider may also give you more specific instructions. If you have problems or questions, contact your health care provider. What can I expect after the procedure? After the procedure, it is common to have:  Bruising and tenderness at the catheter insertion site.  A mild headache. Follow these instructions at home: Insertion site care  Follow instructions from your health care provider about how to take care of the insertion site. Make sure you: ? Wash your hands with soap and water before and after you change your bandage (dressing). If soap and water are not available, use hand sanitizer. ? Change your dressing as told by your health care provider.  Do not take baths, swim, or use a hot tub until your health care provider approves. You may shower 24-48 hours after the procedure, or as told by your health care provider.  To clean your insertion site: ? Gently wash the site with plain soap and water. ? Pat the area dry with a clean towel. ? Do not rub the site. This may cause bleeding.  Do not apply powder or lotion to the site. Keep the site clean and dry. Infection signs Check your incision area every day for signs of infection. Check for:  Redness, swelling, or pain.  Fluid or blood.  Warmth.  Pus or a bad smell.  Activity  Do not drive for 24 hours if you were given a sedative during your procedure.  Rest as told by your health care provider.  Do not lift anything that is heavier than 10 lb (4.5 kg), or the limit that you are told, until your  health care provider says that it is safe.  Return to your normal activities as told by your health care provider, usually in about a week. Ask your health care provider what activities are safe for you. General instructions   If your insertion site starts to bleed, lie flat and put pressure on the site. If the bleeding does not stop, get help right away. This is a medical emergency.  Do not use any products that contain nicotine or tobacco, such as cigarettes, e-cigarettes, and chewing tobacco. If you need help quitting, ask your health care provider.  Take over-the-counter and prescription medicines only as told by your health care provider.  Drink enough fluid to keep your urine pale yellow. This helps flush the contrast dye from your body.  Keep all follow-up visits as directed by your health care provider. This is important. Contact a health   care provider if:  You have a fever or chills.  You have redness, swelling, or pain around your insertion site.  You have fluid or blood coming from your insertion site.  The insertion site feels warm to the touch.  You have pus or a bad smell coming from your insertion site.  You notice blood collecting in the tissue around the insertion site (hematoma). The hematoma may be painful to the touch. Get help right away if:  You have chest pain or trouble breathing.  You have severe pain or swelling at the insertion site.  The insertion area bleeds, and bleeding continues after 30 minutes of holding steady pressure on the site.  The arm or leg where the catheter was inserted is pale, cold, numb, tingling, or weak.  You have a rash.  You have any symptoms of a stroke. "BE FAST" is an easy way to remember the main warning signs of a stroke: ? B - Balance. Signs are dizziness, sudden trouble walking, or loss of balance. ? E - Eyes. Signs are trouble seeing or a sudden change in vision. ? F - Face. Signs are sudden weakness or numbness of  the face, or the face or eyelid drooping on one side. ? A - Arms. Signs are weakness or numbness in an arm. This happens suddenly and usually on one side of the body. ? S - Speech. Signs are sudden trouble speaking, slurred speech, or trouble understanding what people say. ? T - Time. Time to call emergency services. Write down what time symptoms started.  You have other signs of a stroke, such as: ? A sudden, severe headache with no known cause. ? Nausea or vomiting. ? Seizure. These symptoms may represent a serious problem that is an emergency. Do not wait to see if the symptoms will go away. Get medical help right away. Call your local emergency services (911 in the [place] Do not drive yourself to the hospital. Summary  Bruising and tenderness at the insertion site are common.  Follow your health care provider's instructions about caring for your insertion site. Change dressing and clean the area as instructed.  If your insertion site bleeds, apply direct pressure until bleeding stops.  Return to your normal activities as told by your health care provider. Ask what activities are safe.  Rest and drink plenty of fluids. This information is not intended to replace advice given to you by your health care provider. Make sure you discuss any questions you have with your health care provider. Document Revised: 06/28/2019 Document Reviewed: 06/28/2019 Elsevier Patient Education  2020 [employer] Moderate Conscious Sedation, Adult Sedation is the use of medicines to promote relaxation and relieve discomfort and anxiety. Moderate conscious sedation is a type of sedation. Under moderate conscious sedation, you are less alert than normal, but you are still able to respond to instructions, touch, or both. Moderate conscious sedation is used during short medical and dental procedures. It is milder than deep sedation, which is a type of sedation under which you cannot be easily woken up. It is also  milder than general anesthesia, which is the use of medicines to make you unconscious. Moderate conscious sedation allows you to return to your regular activities sooner. Tell a health care provider about:  Any allergies you have.  All medicines you are taking, including vitamins, herbs, eye drops, creams, and over-the-counter medicines.  Use of steroids (by mouth or creams).  Any problems you or family members have had with   sedatives and anesthetic medicines.  Any blood disorders you have.  Any surgeries you have had.  Any medical conditions you have, such as sleep apnea.  Whether you are pregnant or may be pregnant.  Any use of cigarettes, alcohol, marijuana, or street drugs. What are the risks? Generally, this is a safe procedure. However, problems may occur, including:  Getting too much medicine (oversedation).  Nausea.  Allergic reaction to medicines.  Trouble breathing. If this happens, a breathing tube may be used to help with breathing. It will be removed when you are awake and breathing on your own.  Heart trouble.  Lung trouble. What happens before the procedure? Staying hydrated Follow instructions from your health care provider about hydration, which may include:  Up to 2 hours before the procedure - you may continue to drink clear liquids, such as water, clear fruit juice, black coffee, and plain tea. Eating and drinking restrictions Follow instructions from your health care provider about eating and drinking, which may include:  8 hours before the procedure - stop eating heavy meals or foods such as meat, fried foods, or fatty foods.  6 hours before the procedure - stop eating light meals or foods, such as toast or cereal.  6 hours before the procedure - stop drinking milk or drinks that contain milk.  2 hours before the procedure - stop drinking clear liquids. Medicine Ask your health care provider about:  Changing or stopping your regular medicines.  This is especially important if you are taking diabetes medicines or blood thinners.  Taking medicines such as aspirin and ibuprofen. These medicines can thin your blood. Do not take these medicines before your procedure if your health care provider instructs you not to.  Tests and exams  You will have a physical exam.  You may have blood tests done to show: ? How well your kidneys and liver are working. ? How well your blood can clot. General instructions  Plan to have someone take you home from the hospital or clinic.  If you will be going home right after the procedure, plan to have someone with you for 24 hours. What happens during the procedure?  An IV tube will be inserted into one of your veins.  Medicine to help you relax (sedative) will be given through the IV tube.  The medical or dental procedure will be performed. What happens after the procedure?  Your blood pressure, heart rate, breathing rate, and blood oxygen level will be monitored often until the medicines you were given have worn off.  Do not drive for 24 hours. This information is not intended to replace advice given to you by your health care provider. Make sure you discuss any questions you have with your health care provider. Document Revised: 11/20/2017 Document Reviewed: 03/29/2016 Elsevier Patient Education  2020 [employer] Inc[employer]  

## 2020-11-23 NOTE — Procedures (Signed)
S/P RT common carotid and RT Vertebral arteriograms. RT Rad approach. Findings. 1.Patent RT TS?SS stent without intrastent stenosis. 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 S.Bandy Honaker MD

## 2020-11-30 DIAGNOSIS — K2281 Esophageal polyp: Secondary | ICD-10-CM | POA: Diagnosis not present

## 2020-11-30 DIAGNOSIS — K219 Gastro-esophageal reflux disease without esophagitis: Secondary | ICD-10-CM | POA: Diagnosis not present

## 2020-11-30 DIAGNOSIS — Z1211 Encounter for screening for malignant neoplasm of colon: Secondary | ICD-10-CM | POA: Diagnosis not present

## 2020-11-30 DIAGNOSIS — R1310 Dysphagia, unspecified: Secondary | ICD-10-CM | POA: Diagnosis not present

## 2020-12-18 IMAGING — MR MR HEAD WO/W CM
14 of 16 series · 42 of 48 positions shown · IV contrast (gadavist)
Comparison: Head MRI 11/07/2019

CLINICAL DATA: Follow-up right transverse-sigmoid sinus junction
stenosis treated with stent assisted angioplasty in [DATE].

EXAM:
MRI HEAD WITHOUT AND WITH CONTRAST
MRV HEAD WITHOUT AND WITH CONTRAST
TECHNIQUE: Multiplanar, multiecho pulse sequences of the brain and surrounding
structures were obtained without and with intravenous contrast.
Angiographic images of the intracranial venous structures were
obtained using MRV technique without and with intravenous contrast.
CONTRAST:  10mL GADAVIST GADOBUTROL 1 MMOL/ML IV SOLN

[Series 5: DWI · axial · 3.0mm · 0.88mm/px · z∈[-138,+9]mm · 7 of 100 slices shown (1 of 4)]
[im 1/100]
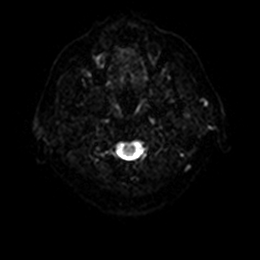
[im 17/100]
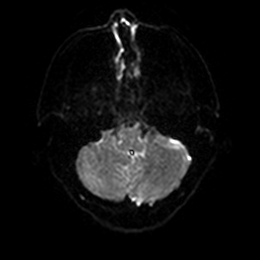
[im 34/100]
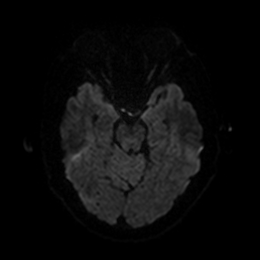
[im 50/100]
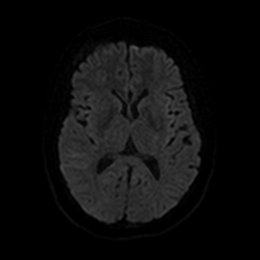
[im 67/100]
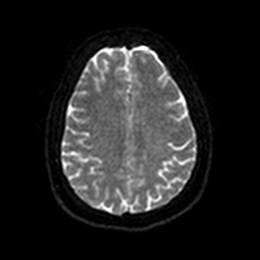
[im 83/100]
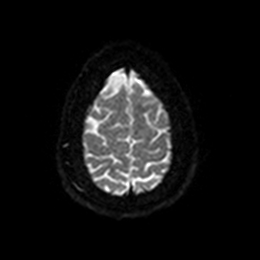
[im 100/100]
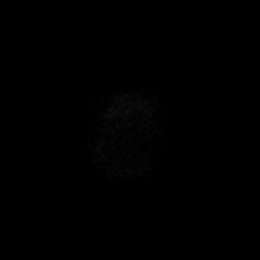

[Series 6: DWI · axial · 3.0mm · 0.88mm/px · z∈[-138,+9]mm · 3 of 50 slices shown (2 of 4)]
[im 1/50]
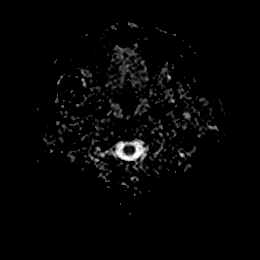
[im 25/50]
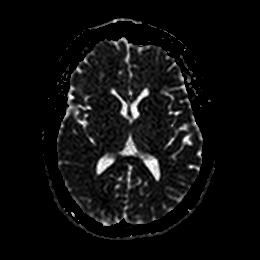
[im 50/50]
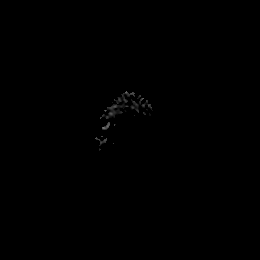

[Series 7: DWI · coronal · 4.0mm · 0.88mm/px · 5 of 76 slices shown (3 of 4)]
[im 1/76]
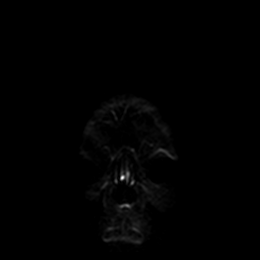
[im 19/76]
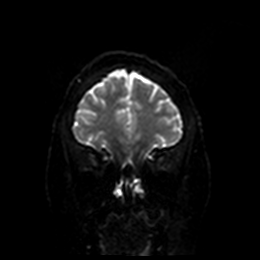
[im 38/76]
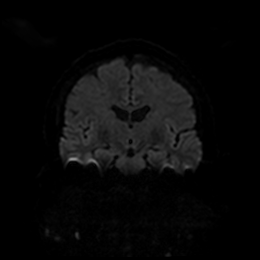
[im 57/76]
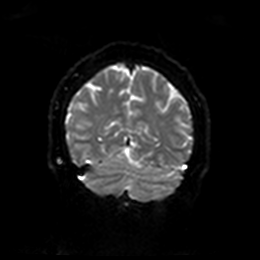
[im 76/76]
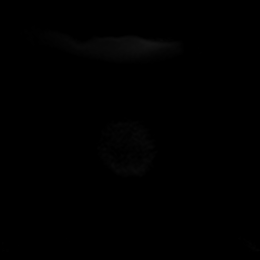

[Series 8: DWI · coronal · 4.0mm · 0.88mm/px · 3 of 38 slices shown (4 of 4)]
[im 1/38]
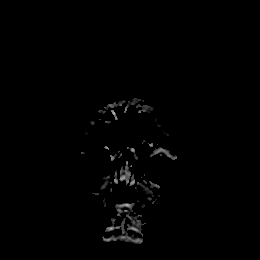
[im 19/38]
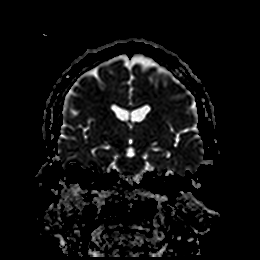
[im 38/38]
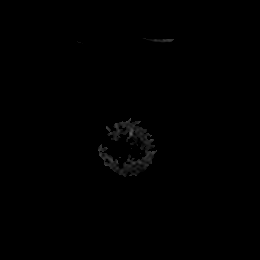

[Series 9: T1 · sagittal · 5.0mm · 0.75mm/px · 2 of 23 slices shown]
[im 1/23]
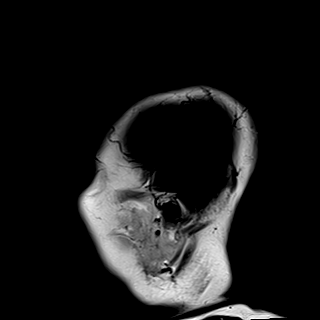
[im 23/23]
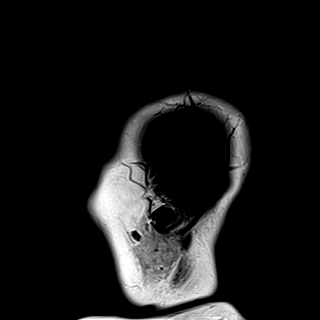

[Series 10: T2 · axial · 5.0mm · 0.90mm/px · z∈[-141,+15]mm · 2 of 27 slices shown]
[im 1/27]
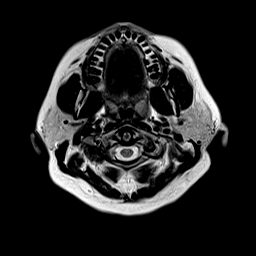
[im 27/27]
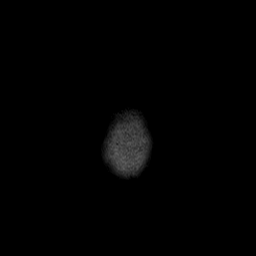

[Series 11: FLAIR · axial · 5.0mm · 0.45mm/px · z∈[-141,+15]mm · 2 of 27 slices shown]
[im 1/27]
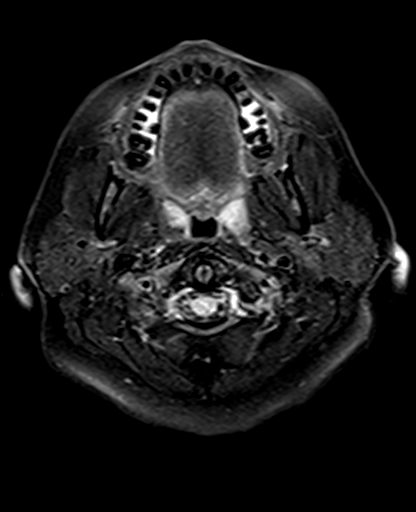
[im 27/27]
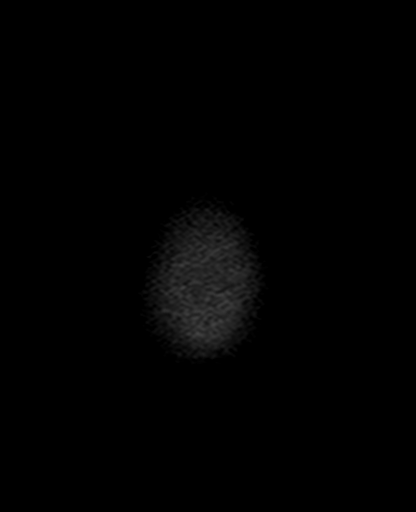

[Series 12: mag_images · axial · 3.0mm · 0.90mm/px · z∈[-141,+11]mm · 3 of 52 slices shown]
[im 1/52]
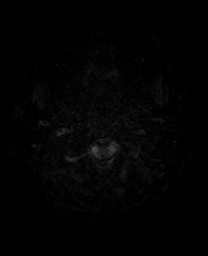
[im 26/52]
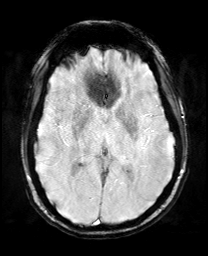
[im 52/52]
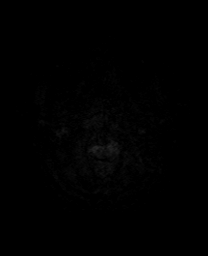

[Series 13: pha_images · axial · 3.0mm · 0.90mm/px · z∈[-135,+11]mm · 3 of 50 slices shown]
[im 1/50]
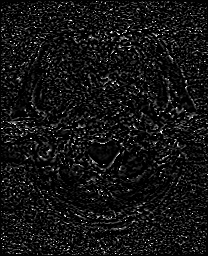
[im 25/50]
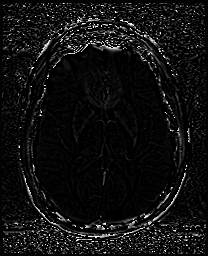
[im 50/50]
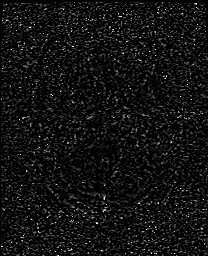

[Series 14: swi_images · axial · 3.0mm · 0.90mm/px · z∈[-141,+11]mm · 3 of 52 slices shown]
[im 1/52]
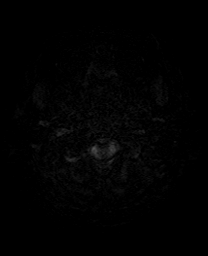
[im 26/52]
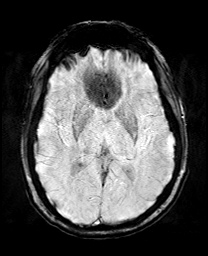
[im 52/52]
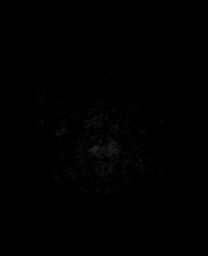

[Series 15: mip_images(sw) · axial · 24.0mm · 0.90mm/px · z∈[-131,+1]mm · 3 of 45 slices shown]
[im 1/45]
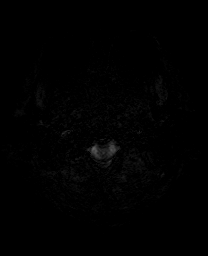
[im 23/45]
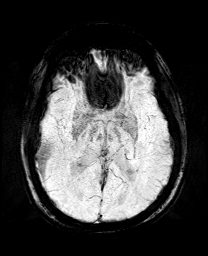
[im 45/45]
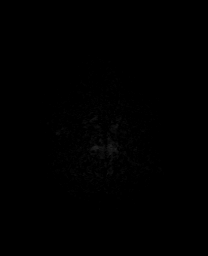

[Series 17: T2 post-contrast · coronal · 5.0mm · 0.72mm/px · 2 of 30 slices shown]
[im 1/30]
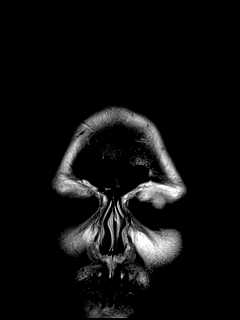
[im 30/30]
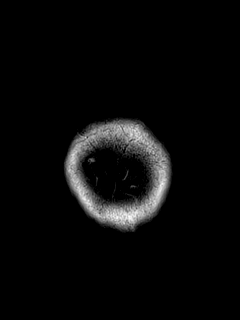

[Series 19: T1 post-contrast · coronal · 5.0mm · 0.43mm/px · 2 of 30 slices shown (1 of 2)]
[im 1/30]
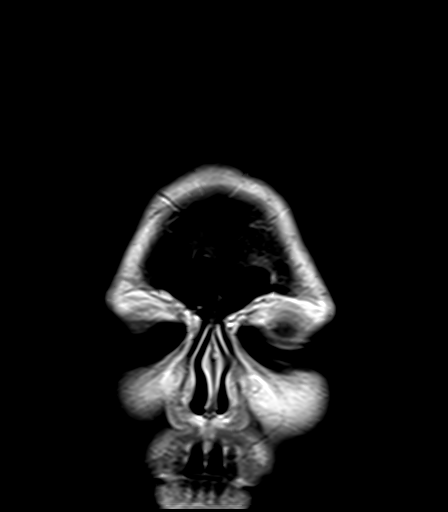
[im 30/30]
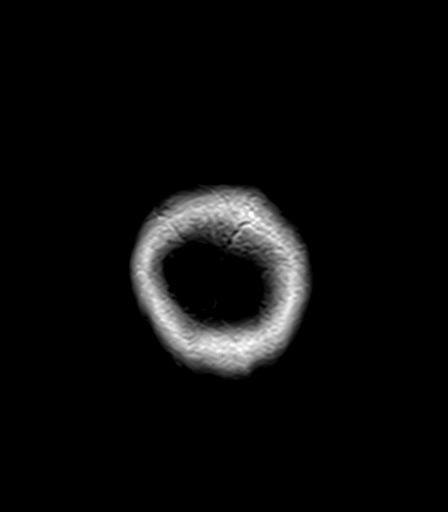

[Series 20: T1 post-contrast · sagittal · 5.0mm · 0.75mm/px · 2 of 23 slices shown (2 of 2)]
[im 1/23]
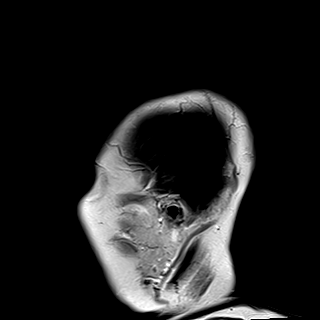
[im 23/23]
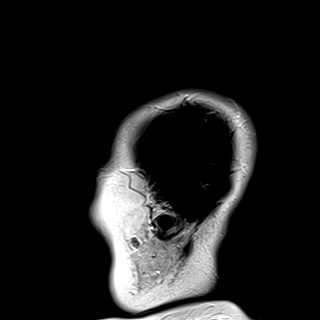

[42 of 48 positions shown; findings below may reference images not displayed]

FINDINGS: MRI HEAD FINDINGS

Brain: There is no evidence of acute infarct, intracranial
hemorrhage, mass, midline shift, or extra-axial fluid collection.
The ventricles and sulci are normal. The brain is normal in signal.

Vascular: Major intracranial vascular flow voids are preserved.
Susceptibility artifact from the right transverse sinus stent.

Skull and upper cervical spine: Unremarkable bone marrow signal.

Sinuses/Orbits: Unremarkable orbits. Small volume fluid in the left
sphenoid sinus, also present previously. Clear mastoid air cells.

Other: None.

MRV HEAD FINDINGS

A stent extends from the mid right transverse sinus to the
transverse-sigmoid sinus junction. There is associated
susceptibility artifact which results in near complete signal loss
with all MRV techniques (noncontrast time-of-flight, phase contrast,
and T1 postcontrast imaging) which precludes assessment for in-stent
stenosis. The right transverse and sigmoid sinuses are widely patent
proximal and distal to the stent. The superior sagittal sinus,
internal cerebral veins, vein of [place], and straight sinus are also
patent. The left transverse and sigmoid sinuses are chronically
hypoplastic.
IMPRESSION: 1. Artifact from the right transverse sinus stent precludes
assessment for in-stent stenosis. Otherwise negative MRV without
evidence of dural venous sinus thrombosis or significant stenosis
proximal or distal to the stent.
2. Unremarkable appearance of the brain itself.

## 2021-01-04 DIAGNOSIS — Z1159 Encounter for screening for other viral diseases: Secondary | ICD-10-CM | POA: Diagnosis not present

## 2021-01-09 DIAGNOSIS — R1310 Dysphagia, unspecified: Secondary | ICD-10-CM | POA: Diagnosis not present

## 2021-01-09 DIAGNOSIS — K317 Polyp of stomach and duodenum: Secondary | ICD-10-CM | POA: Diagnosis not present

## 2021-03-17 DIAGNOSIS — J0100 Acute maxillary sinusitis, unspecified: Secondary | ICD-10-CM | POA: Diagnosis not present

## 2021-03-27 DIAGNOSIS — F419 Anxiety disorder, unspecified: Secondary | ICD-10-CM | POA: Diagnosis not present

## 2021-05-14 DIAGNOSIS — H6123 Impacted cerumen, bilateral: Secondary | ICD-10-CM | POA: Diagnosis not present

## 2021-09-26 DIAGNOSIS — Z0000 Encounter for general adult medical examination without abnormal findings: Secondary | ICD-10-CM | POA: Diagnosis not present

## 2021-09-26 DIAGNOSIS — Z79899 Other long term (current) drug therapy: Secondary | ICD-10-CM | POA: Diagnosis not present

## 2021-09-26 DIAGNOSIS — K219 Gastro-esophageal reflux disease without esophagitis: Secondary | ICD-10-CM | POA: Diagnosis not present

## 2021-09-26 DIAGNOSIS — E559 Vitamin D deficiency, unspecified: Secondary | ICD-10-CM | POA: Diagnosis not present

## 2021-09-26 DIAGNOSIS — E538 Deficiency of other specified B group vitamins: Secondary | ICD-10-CM | POA: Diagnosis not present

## 2021-09-26 DIAGNOSIS — Z23 Encounter for immunization: Secondary | ICD-10-CM | POA: Diagnosis not present

## 2021-09-26 DIAGNOSIS — F419 Anxiety disorder, unspecified: Secondary | ICD-10-CM | POA: Diagnosis not present

## 2021-09-26 DIAGNOSIS — R030 Elevated blood-pressure reading, without diagnosis of hypertension: Secondary | ICD-10-CM | POA: Diagnosis not present

## 2021-09-26 DIAGNOSIS — Z1159 Encounter for screening for other viral diseases: Secondary | ICD-10-CM | POA: Diagnosis not present

## 2021-09-26 DIAGNOSIS — Z13220 Encounter for screening for lipoid disorders: Secondary | ICD-10-CM | POA: Diagnosis not present

## 2021-10-15 DIAGNOSIS — Z1231 Encounter for screening mammogram for malignant neoplasm of breast: Secondary | ICD-10-CM | POA: Diagnosis not present

## 2021-10-15 DIAGNOSIS — Z01419 Encounter for gynecological examination (general) (routine) without abnormal findings: Secondary | ICD-10-CM | POA: Diagnosis not present

## 2021-10-15 DIAGNOSIS — Z6841 Body Mass Index (BMI) 40.0 and over, adult: Secondary | ICD-10-CM | POA: Diagnosis not present

## 2021-11-25 DIAGNOSIS — R519 Headache, unspecified: Secondary | ICD-10-CM | POA: Diagnosis not present

## 2021-11-25 DIAGNOSIS — J324 Chronic pansinusitis: Secondary | ICD-10-CM | POA: Diagnosis not present

## 2021-11-27 ENCOUNTER — Other Ambulatory Visit (HOSPITAL_COMMUNITY): Payer: Self-pay | Admitting: Interventional Radiology

## 2021-12-09 ENCOUNTER — Other Ambulatory Visit (HOSPITAL_COMMUNITY): Payer: Self-pay | Admitting: Interventional Radiology

## 2021-12-09 DIAGNOSIS — I771 Stricture of artery: Secondary | ICD-10-CM

## 2021-12-11 DIAGNOSIS — N950 Postmenopausal bleeding: Secondary | ICD-10-CM | POA: Diagnosis not present

## 2021-12-12 DIAGNOSIS — M549 Dorsalgia, unspecified: Secondary | ICD-10-CM | POA: Diagnosis not present

## 2021-12-12 DIAGNOSIS — N3001 Acute cystitis with hematuria: Secondary | ICD-10-CM | POA: Diagnosis not present

## 2021-12-12 DIAGNOSIS — R109 Unspecified abdominal pain: Secondary | ICD-10-CM | POA: Diagnosis not present

## 2021-12-12 DIAGNOSIS — R319 Hematuria, unspecified: Secondary | ICD-10-CM | POA: Diagnosis not present

## 2021-12-25 DIAGNOSIS — N950 Postmenopausal bleeding: Secondary | ICD-10-CM | POA: Diagnosis not present

## 2021-12-26 ENCOUNTER — Other Ambulatory Visit: Payer: Self-pay | Admitting: Student

## 2021-12-26 ENCOUNTER — Other Ambulatory Visit (HOSPITAL_COMMUNITY): Payer: Self-pay | Admitting: Interventional Radiology

## 2021-12-26 ENCOUNTER — Ambulatory Visit (HOSPITAL_COMMUNITY): Payer: BC Managed Care – PPO

## 2021-12-26 DIAGNOSIS — H9319 Tinnitus, unspecified ear: Secondary | ICD-10-CM

## 2021-12-26 DIAGNOSIS — Q248 Other specified congenital malformations of heart: Secondary | ICD-10-CM

## 2021-12-26 MED ORDER — LORAZEPAM 1 MG PO TABS: 1.0000 mg | ORAL_TABLET | ORAL | 0 refills | Status: DC | PRN

## 2021-12-30 ENCOUNTER — Ambulatory Visit (HOSPITAL_COMMUNITY)
Admission: RE | Admit: 2021-12-30 | Discharge: 2021-12-30 | Disposition: A | Discharge status: Home / Self Care | Payer: BC Managed Care – PPO | Source: Ambulatory Visit | Attending: Interventional Radiology | Admitting: Interventional Radiology

## 2021-12-30 ENCOUNTER — Other Ambulatory Visit: Payer: Self-pay

## 2021-12-30 DIAGNOSIS — I771 Stricture of artery: Secondary | ICD-10-CM | POA: Insufficient documentation

## 2021-12-30 DIAGNOSIS — R519 Headache, unspecified: Secondary | ICD-10-CM | POA: Diagnosis not present

## 2021-12-30 MED ORDER — GADOBUTROL 1 MMOL/ML IV SOLN
10.0000 mL | Freq: Once | INTRAVENOUS | Status: AC | PRN
  Administered 2021-12-30: 10 mL via INTRAVENOUS

## 2022-01-02 ENCOUNTER — Telehealth (HOSPITAL_COMMUNITY): Payer: Self-pay

## 2022-01-02 DIAGNOSIS — E538 Deficiency of other specified B group vitamins: Secondary | ICD-10-CM | POA: Diagnosis not present

## 2022-01-02 DIAGNOSIS — E785 Hyperlipidemia, unspecified: Secondary | ICD-10-CM | POA: Diagnosis not present

## 2022-01-02 DIAGNOSIS — E559 Vitamin D deficiency, unspecified: Secondary | ICD-10-CM | POA: Diagnosis not present

## 2022-01-02 NOTE — Telephone Encounter (Signed)
Pt agreed to f/u in 1 year with MRV w/wo. AW

## 2022-02-17 DIAGNOSIS — M546 Pain in thoracic spine: Secondary | ICD-10-CM | POA: Diagnosis not present

## 2022-02-17 DIAGNOSIS — L309 Dermatitis, unspecified: Secondary | ICD-10-CM | POA: Diagnosis not present

## 2022-03-13 DIAGNOSIS — R310 Gross hematuria: Secondary | ICD-10-CM | POA: Diagnosis not present

## 2022-03-13 DIAGNOSIS — R300 Dysuria: Secondary | ICD-10-CM | POA: Diagnosis not present

## 2022-03-13 DIAGNOSIS — N3090 Cystitis, unspecified without hematuria: Secondary | ICD-10-CM | POA: Diagnosis not present

## 2022-03-27 DIAGNOSIS — F419 Anxiety disorder, unspecified: Secondary | ICD-10-CM | POA: Diagnosis not present

## 2022-03-27 DIAGNOSIS — R310 Gross hematuria: Secondary | ICD-10-CM | POA: Diagnosis not present

## 2022-04-24 DIAGNOSIS — R310 Gross hematuria: Secondary | ICD-10-CM | POA: Diagnosis not present

## 2022-05-01 DIAGNOSIS — R3129 Other microscopic hematuria: Secondary | ICD-10-CM | POA: Diagnosis not present

## 2022-05-01 DIAGNOSIS — R3121 Asymptomatic microscopic hematuria: Secondary | ICD-10-CM | POA: Diagnosis not present

## 2022-05-20 DIAGNOSIS — R310 Gross hematuria: Secondary | ICD-10-CM | POA: Diagnosis not present

## 2022-06-30 DIAGNOSIS — F419 Anxiety disorder, unspecified: Secondary | ICD-10-CM | POA: Diagnosis not present

## 2022-07-14 DIAGNOSIS — J324 Chronic pansinusitis: Secondary | ICD-10-CM | POA: Diagnosis not present

## 2022-07-14 DIAGNOSIS — R0981 Nasal congestion: Secondary | ICD-10-CM | POA: Diagnosis not present

## 2022-07-14 DIAGNOSIS — R051 Acute cough: Secondary | ICD-10-CM | POA: Diagnosis not present

## 2022-08-12 DIAGNOSIS — N939 Abnormal uterine and vaginal bleeding, unspecified: Secondary | ICD-10-CM | POA: Diagnosis not present

## 2022-08-12 DIAGNOSIS — N950 Postmenopausal bleeding: Secondary | ICD-10-CM | POA: Diagnosis not present

## 2022-09-02 DIAGNOSIS — N950 Postmenopausal bleeding: Secondary | ICD-10-CM | POA: Diagnosis not present

## 2022-09-11 DIAGNOSIS — N950 Postmenopausal bleeding: Secondary | ICD-10-CM | POA: Diagnosis not present

## 2022-09-19 DIAGNOSIS — M7731 Calcaneal spur, right foot: Secondary | ICD-10-CM | POA: Diagnosis not present

## 2022-09-19 DIAGNOSIS — M792 Neuralgia and neuritis, unspecified: Secondary | ICD-10-CM | POA: Diagnosis not present

## 2022-09-19 DIAGNOSIS — B070 Plantar wart: Secondary | ICD-10-CM | POA: Diagnosis not present

## 2022-09-19 DIAGNOSIS — M7732 Calcaneal spur, left foot: Secondary | ICD-10-CM | POA: Diagnosis not present

## 2022-09-26 DIAGNOSIS — E785 Hyperlipidemia, unspecified: Secondary | ICD-10-CM | POA: Diagnosis not present

## 2022-09-26 DIAGNOSIS — Z79899 Other long term (current) drug therapy: Secondary | ICD-10-CM | POA: Diagnosis not present

## 2022-09-26 DIAGNOSIS — R1310 Dysphagia, unspecified: Secondary | ICD-10-CM | POA: Diagnosis not present

## 2022-09-26 DIAGNOSIS — Z0000 Encounter for general adult medical examination without abnormal findings: Secondary | ICD-10-CM | POA: Diagnosis not present

## 2022-09-26 DIAGNOSIS — Z23 Encounter for immunization: Secondary | ICD-10-CM | POA: Diagnosis not present

## 2022-09-26 DIAGNOSIS — M79642 Pain in left hand: Secondary | ICD-10-CM | POA: Diagnosis not present

## 2022-09-26 DIAGNOSIS — E559 Vitamin D deficiency, unspecified: Secondary | ICD-10-CM | POA: Diagnosis not present

## 2022-09-26 DIAGNOSIS — K219 Gastro-esophageal reflux disease without esophagitis: Secondary | ICD-10-CM | POA: Diagnosis not present

## 2022-09-26 DIAGNOSIS — F419 Anxiety disorder, unspecified: Secondary | ICD-10-CM | POA: Diagnosis not present

## 2022-09-26 DIAGNOSIS — E538 Deficiency of other specified B group vitamins: Secondary | ICD-10-CM | POA: Diagnosis not present

## 2022-10-09 DIAGNOSIS — B070 Plantar wart: Secondary | ICD-10-CM | POA: Diagnosis not present

## 2022-10-20 DIAGNOSIS — J019 Acute sinusitis, unspecified: Secondary | ICD-10-CM | POA: Diagnosis not present

## 2022-10-27 DIAGNOSIS — Z6833 Body mass index (BMI) 33.0-33.9, adult: Secondary | ICD-10-CM | POA: Diagnosis not present

## 2022-10-27 DIAGNOSIS — Z1231 Encounter for screening mammogram for malignant neoplasm of breast: Secondary | ICD-10-CM | POA: Diagnosis not present

## 2022-10-27 DIAGNOSIS — Z01419 Encounter for gynecological examination (general) (routine) without abnormal findings: Secondary | ICD-10-CM | POA: Diagnosis not present

## 2022-10-30 DIAGNOSIS — B070 Plantar wart: Secondary | ICD-10-CM | POA: Diagnosis not present

## 2022-11-20 DIAGNOSIS — R399 Unspecified symptoms and signs involving the genitourinary system: Secondary | ICD-10-CM | POA: Diagnosis not present

## 2022-11-21 DIAGNOSIS — B070 Plantar wart: Secondary | ICD-10-CM | POA: Diagnosis not present

## 2022-12-01 DIAGNOSIS — J324 Chronic pansinusitis: Secondary | ICD-10-CM | POA: Diagnosis not present

## 2022-12-11 DIAGNOSIS — B070 Plantar wart: Secondary | ICD-10-CM | POA: Diagnosis not present

## 2023-01-01 DIAGNOSIS — L02619 Cutaneous abscess of unspecified foot: Secondary | ICD-10-CM | POA: Diagnosis not present

## 2023-01-01 DIAGNOSIS — B070 Plantar wart: Secondary | ICD-10-CM | POA: Diagnosis not present

## 2023-01-01 DIAGNOSIS — M7731 Calcaneal spur, right foot: Secondary | ICD-10-CM | POA: Diagnosis not present

## 2023-01-14 DIAGNOSIS — Z713 Dietary counseling and surveillance: Secondary | ICD-10-CM | POA: Diagnosis not present

## 2023-01-14 DIAGNOSIS — K219 Gastro-esophageal reflux disease without esophagitis: Secondary | ICD-10-CM | POA: Diagnosis not present

## 2023-01-14 DIAGNOSIS — E669 Obesity, unspecified: Secondary | ICD-10-CM | POA: Diagnosis not present

## 2023-01-14 DIAGNOSIS — Z6831 Body mass index (BMI) 31.0-31.9, adult: Secondary | ICD-10-CM | POA: Diagnosis not present

## 2023-01-15 DIAGNOSIS — B070 Plantar wart: Secondary | ICD-10-CM | POA: Diagnosis not present

## 2023-01-26 ENCOUNTER — Other Ambulatory Visit (HOSPITAL_COMMUNITY): Payer: Self-pay | Admitting: Interventional Radiology

## 2023-01-26 ENCOUNTER — Other Ambulatory Visit: Payer: Self-pay | Admitting: Radiology

## 2023-01-26 DIAGNOSIS — H9319 Tinnitus, unspecified ear: Secondary | ICD-10-CM

## 2023-01-26 DIAGNOSIS — I771 Stricture of artery: Secondary | ICD-10-CM

## 2023-01-26 MED ORDER — LORAZEPAM 1 MG PO TABS: 1.0000 mg | ORAL_TABLET | ORAL | 0 refills | Status: AC | PRN

## 2023-02-05 ENCOUNTER — Ambulatory Visit (HOSPITAL_COMMUNITY)
Admission: RE | Admit: 2023-02-05 | Discharge: 2023-02-05 | Disposition: A | Discharge status: Home / Self Care | Payer: BC Managed Care – PPO | Source: Ambulatory Visit | Attending: Interventional Radiology | Admitting: Interventional Radiology

## 2023-02-05 DIAGNOSIS — I771 Stricture of artery: Secondary | ICD-10-CM | POA: Insufficient documentation

## 2023-02-05 DIAGNOSIS — H93A9 Pulsatile tinnitus, unspecified ear: Secondary | ICD-10-CM | POA: Diagnosis not present

## 2023-02-05 MED ORDER — GADOBUTROL 1 MMOL/ML IV SOLN
10.0000 mL | Freq: Once | INTRAVENOUS | Status: AC | PRN
  Administered 2023-02-05: 10 mL via INTRAVENOUS

## 2023-02-09 ENCOUNTER — Telehealth (HOSPITAL_COMMUNITY): Payer: Self-pay

## 2023-02-09 NOTE — Telephone Encounter (Signed)
Pt is not having any symptoms. She agreed to f/u in 1 year with an MRV head. AB

## 2023-04-01 DIAGNOSIS — M792 Neuralgia and neuritis, unspecified: Secondary | ICD-10-CM | POA: Diagnosis not present

## 2023-04-15 DIAGNOSIS — J324 Chronic pansinusitis: Secondary | ICD-10-CM | POA: Diagnosis not present

## 2023-04-15 DIAGNOSIS — R519 Headache, unspecified: Secondary | ICD-10-CM | POA: Diagnosis not present

## 2023-04-15 DIAGNOSIS — R070 Pain in throat: Secondary | ICD-10-CM | POA: Diagnosis not present

## 2023-04-22 DIAGNOSIS — J069 Acute upper respiratory infection, unspecified: Secondary | ICD-10-CM | POA: Diagnosis not present

## 2023-05-25 DIAGNOSIS — J329 Chronic sinusitis, unspecified: Secondary | ICD-10-CM | POA: Diagnosis not present

## 2023-06-16 DIAGNOSIS — J329 Chronic sinusitis, unspecified: Secondary | ICD-10-CM | POA: Diagnosis not present

## 2023-06-18 DIAGNOSIS — K219 Gastro-esophageal reflux disease without esophagitis: Secondary | ICD-10-CM | POA: Diagnosis not present

## 2023-06-18 DIAGNOSIS — F419 Anxiety disorder, unspecified: Secondary | ICD-10-CM | POA: Diagnosis not present

## 2023-06-18 DIAGNOSIS — E669 Obesity, unspecified: Secondary | ICD-10-CM | POA: Diagnosis not present

## 2023-06-18 DIAGNOSIS — E785 Hyperlipidemia, unspecified: Secondary | ICD-10-CM | POA: Diagnosis not present

## 2023-08-17 DIAGNOSIS — M7522 Bicipital tendinitis, left shoulder: Secondary | ICD-10-CM | POA: Diagnosis not present

## 2023-09-14 DIAGNOSIS — M7520 Bicipital tendinitis, unspecified shoulder: Secondary | ICD-10-CM | POA: Diagnosis not present

## 2023-10-12 DIAGNOSIS — E785 Hyperlipidemia, unspecified: Secondary | ICD-10-CM | POA: Diagnosis not present

## 2023-10-12 DIAGNOSIS — K219 Gastro-esophageal reflux disease without esophagitis: Secondary | ICD-10-CM | POA: Diagnosis not present

## 2023-10-12 DIAGNOSIS — E538 Deficiency of other specified B group vitamins: Secondary | ICD-10-CM | POA: Diagnosis not present

## 2023-10-12 DIAGNOSIS — Z0000 Encounter for general adult medical examination without abnormal findings: Secondary | ICD-10-CM | POA: Diagnosis not present

## 2023-10-12 DIAGNOSIS — E559 Vitamin D deficiency, unspecified: Secondary | ICD-10-CM | POA: Diagnosis not present

## 2023-10-12 DIAGNOSIS — F419 Anxiety disorder, unspecified: Secondary | ICD-10-CM | POA: Diagnosis not present

## 2023-11-03 DIAGNOSIS — Z01419 Encounter for gynecological examination (general) (routine) without abnormal findings: Secondary | ICD-10-CM | POA: Diagnosis not present

## 2023-11-03 DIAGNOSIS — Z1231 Encounter for screening mammogram for malignant neoplasm of breast: Secondary | ICD-10-CM | POA: Diagnosis not present

## 2023-11-03 DIAGNOSIS — Z6841 Body Mass Index (BMI) 40.0 and over, adult: Secondary | ICD-10-CM | POA: Diagnosis not present

## 2023-11-03 DIAGNOSIS — R6882 Decreased libido: Secondary | ICD-10-CM | POA: Diagnosis not present

## 2023-11-04 DIAGNOSIS — Z8616 Personal history of COVID-19: Secondary | ICD-10-CM | POA: Diagnosis not present

## 2023-11-04 DIAGNOSIS — F50811 Binge eating disorder, moderate: Secondary | ICD-10-CM | POA: Diagnosis not present

## 2023-11-04 DIAGNOSIS — Z131 Encounter for screening for diabetes mellitus: Secondary | ICD-10-CM | POA: Diagnosis not present

## 2023-11-04 DIAGNOSIS — Z1331 Encounter for screening for depression: Secondary | ICD-10-CM | POA: Diagnosis not present

## 2023-11-04 DIAGNOSIS — R635 Abnormal weight gain: Secondary | ICD-10-CM | POA: Diagnosis not present

## 2023-11-04 DIAGNOSIS — E785 Hyperlipidemia, unspecified: Secondary | ICD-10-CM | POA: Diagnosis not present

## 2023-11-04 DIAGNOSIS — E559 Vitamin D deficiency, unspecified: Secondary | ICD-10-CM | POA: Diagnosis not present

## 2023-11-09 ENCOUNTER — Other Ambulatory Visit: Payer: Self-pay | Admitting: Obstetrics and Gynecology

## 2023-11-09 DIAGNOSIS — R928 Other abnormal and inconclusive findings on diagnostic imaging of breast: Secondary | ICD-10-CM

## 2023-11-12 DIAGNOSIS — R6882 Decreased libido: Secondary | ICD-10-CM | POA: Diagnosis not present

## 2023-11-24 DIAGNOSIS — E559 Vitamin D deficiency, unspecified: Secondary | ICD-10-CM | POA: Diagnosis not present

## 2023-11-24 DIAGNOSIS — E785 Hyperlipidemia, unspecified: Secondary | ICD-10-CM | POA: Diagnosis not present

## 2023-11-24 DIAGNOSIS — Z6841 Body Mass Index (BMI) 40.0 and over, adult: Secondary | ICD-10-CM | POA: Diagnosis not present

## 2023-11-24 DIAGNOSIS — F50811 Binge eating disorder, moderate: Secondary | ICD-10-CM | POA: Diagnosis not present

## 2023-11-24 DIAGNOSIS — E66813 Obesity, class 3: Secondary | ICD-10-CM | POA: Diagnosis not present

## 2023-11-24 DIAGNOSIS — R635 Abnormal weight gain: Secondary | ICD-10-CM | POA: Diagnosis not present

## 2023-11-24 DIAGNOSIS — Z79899 Other long term (current) drug therapy: Secondary | ICD-10-CM | POA: Diagnosis not present

## 2023-11-25 ENCOUNTER — Ambulatory Visit
Admission: RE | Admit: 2023-11-25 | Discharge: 2023-11-25 | Disposition: A | Discharge status: Home / Self Care | Payer: BC Managed Care – PPO | Source: Ambulatory Visit | Attending: Obstetrics and Gynecology | Admitting: Obstetrics and Gynecology

## 2023-11-25 DIAGNOSIS — R928 Other abnormal and inconclusive findings on diagnostic imaging of breast: Secondary | ICD-10-CM | POA: Diagnosis not present

## 2023-12-08 DIAGNOSIS — E785 Hyperlipidemia, unspecified: Secondary | ICD-10-CM | POA: Diagnosis not present

## 2023-12-08 DIAGNOSIS — E559 Vitamin D deficiency, unspecified: Secondary | ICD-10-CM | POA: Diagnosis not present

## 2023-12-08 DIAGNOSIS — R635 Abnormal weight gain: Secondary | ICD-10-CM | POA: Diagnosis not present

## 2023-12-08 DIAGNOSIS — Z79899 Other long term (current) drug therapy: Secondary | ICD-10-CM | POA: Diagnosis not present

## 2023-12-08 DIAGNOSIS — F50811 Binge eating disorder, moderate: Secondary | ICD-10-CM | POA: Diagnosis not present

## 2023-12-08 DIAGNOSIS — Z6841 Body Mass Index (BMI) 40.0 and over, adult: Secondary | ICD-10-CM | POA: Diagnosis not present

## 2024-01-19 DIAGNOSIS — R635 Abnormal weight gain: Secondary | ICD-10-CM | POA: Diagnosis not present

## 2024-01-19 DIAGNOSIS — E785 Hyperlipidemia, unspecified: Secondary | ICD-10-CM | POA: Diagnosis not present

## 2024-01-19 DIAGNOSIS — E559 Vitamin D deficiency, unspecified: Secondary | ICD-10-CM | POA: Diagnosis not present

## 2024-01-19 DIAGNOSIS — Z9189 Other specified personal risk factors, not elsewhere classified: Secondary | ICD-10-CM | POA: Diagnosis not present

## 2024-02-01 ENCOUNTER — Encounter (HOSPITAL_COMMUNITY): Payer: Self-pay | Admitting: Interventional Radiology

## 2024-02-01 DIAGNOSIS — H93A9 Pulsatile tinnitus, unspecified ear: Secondary | ICD-10-CM

## 2024-02-02 DIAGNOSIS — R059 Cough, unspecified: Secondary | ICD-10-CM | POA: Diagnosis not present

## 2024-02-02 DIAGNOSIS — R519 Headache, unspecified: Secondary | ICD-10-CM | POA: Diagnosis not present

## 2024-02-02 DIAGNOSIS — R0981 Nasal congestion: Secondary | ICD-10-CM | POA: Diagnosis not present

## 2024-02-08 ENCOUNTER — Other Ambulatory Visit (HOSPITAL_COMMUNITY): Payer: Self-pay | Admitting: Interventional Radiology

## 2024-02-08 ENCOUNTER — Telehealth (HOSPITAL_COMMUNITY): Payer: Self-pay

## 2024-02-08 DIAGNOSIS — H93A9 Pulsatile tinnitus, unspecified ear: Secondary | ICD-10-CM

## 2024-02-08 DIAGNOSIS — I771 Stricture of artery: Secondary | ICD-10-CM

## 2024-02-08 NOTE — Telephone Encounter (Signed)
Sent a message to Stephanie [name] regarding meds prior to MRA for claustrophobia. AB

## 2024-02-09 ENCOUNTER — Other Ambulatory Visit: Payer: Self-pay | Admitting: Student

## 2024-02-09 MED ORDER — DIAZEPAM 5 MG PO TABS: 5.0000 mg | ORAL_TABLET | Freq: Two times a day (BID) | ORAL | 0 refills | Status: AC

## 2024-02-15 DIAGNOSIS — E559 Vitamin D deficiency, unspecified: Secondary | ICD-10-CM | POA: Diagnosis not present

## 2024-02-15 DIAGNOSIS — E785 Hyperlipidemia, unspecified: Secondary | ICD-10-CM | POA: Diagnosis not present

## 2024-02-15 DIAGNOSIS — R635 Abnormal weight gain: Secondary | ICD-10-CM | POA: Diagnosis not present

## 2024-02-17 ENCOUNTER — Ambulatory Visit (HOSPITAL_COMMUNITY)
Admission: RE | Admit: 2024-02-17 | Discharge: 2024-02-17 | Disposition: A | Discharge status: Home / Self Care | Payer: BC Managed Care – PPO | Source: Ambulatory Visit | Attending: Interventional Radiology | Admitting: Interventional Radiology

## 2024-02-17 DIAGNOSIS — I771 Stricture of artery: Secondary | ICD-10-CM | POA: Diagnosis not present

## 2024-02-17 DIAGNOSIS — H93A9 Pulsatile tinnitus, unspecified ear: Secondary | ICD-10-CM

## 2024-02-17 MED ORDER — GADOBUTROL 1 MMOL/ML IV SOLN
10.0000 mL | Freq: Once | INTRAVENOUS | Status: AC | PRN
  Administered 2024-02-17: 10 mL via INTRAVENOUS

## 2024-02-18 DIAGNOSIS — Z13820 Encounter for screening for osteoporosis: Secondary | ICD-10-CM | POA: Diagnosis not present

## 2024-02-18 DIAGNOSIS — R6882 Decreased libido: Secondary | ICD-10-CM | POA: Diagnosis not present

## 2024-03-17 ENCOUNTER — Telehealth (HOSPITAL_COMMUNITY): Payer: Self-pay

## 2024-03-17 NOTE — Telephone Encounter (Signed)
 Pt agreed to f/u in 1 year with an mrv. AB

## 2024-03-23 DIAGNOSIS — E785 Hyperlipidemia, unspecified: Secondary | ICD-10-CM | POA: Diagnosis not present

## 2024-03-23 DIAGNOSIS — F50811 Binge eating disorder, moderate: Secondary | ICD-10-CM | POA: Diagnosis not present

## 2024-03-23 DIAGNOSIS — E559 Vitamin D deficiency, unspecified: Secondary | ICD-10-CM | POA: Diagnosis not present

## 2024-04-12 DIAGNOSIS — G479 Sleep disorder, unspecified: Secondary | ICD-10-CM | POA: Diagnosis not present

## 2024-04-12 DIAGNOSIS — F419 Anxiety disorder, unspecified: Secondary | ICD-10-CM | POA: Diagnosis not present

## 2024-05-05 DIAGNOSIS — E785 Hyperlipidemia, unspecified: Secondary | ICD-10-CM | POA: Diagnosis not present

## 2024-05-05 DIAGNOSIS — E559 Vitamin D deficiency, unspecified: Secondary | ICD-10-CM | POA: Diagnosis not present

## 2024-05-05 DIAGNOSIS — R635 Abnormal weight gain: Secondary | ICD-10-CM | POA: Diagnosis not present

## 2024-05-05 DIAGNOSIS — F50811 Binge eating disorder, moderate: Secondary | ICD-10-CM | POA: Diagnosis not present

## 2024-05-17 DIAGNOSIS — R519 Headache, unspecified: Secondary | ICD-10-CM | POA: Diagnosis not present

## 2024-05-17 DIAGNOSIS — R0981 Nasal congestion: Secondary | ICD-10-CM | POA: Diagnosis not present

## 2024-05-17 DIAGNOSIS — R059 Cough, unspecified: Secondary | ICD-10-CM | POA: Diagnosis not present

## 2024-06-02 DIAGNOSIS — E559 Vitamin D deficiency, unspecified: Secondary | ICD-10-CM | POA: Diagnosis not present

## 2024-06-02 DIAGNOSIS — R635 Abnormal weight gain: Secondary | ICD-10-CM | POA: Diagnosis not present

## 2024-06-02 DIAGNOSIS — E785 Hyperlipidemia, unspecified: Secondary | ICD-10-CM | POA: Diagnosis not present

## 2024-06-02 DIAGNOSIS — F50811 Binge eating disorder, moderate: Secondary | ICD-10-CM | POA: Diagnosis not present

## 2024-06-17 ENCOUNTER — Encounter (HOSPITAL_COMMUNITY): Payer: Self-pay | Admitting: Interventional Radiology

## 2024-06-30 DIAGNOSIS — E559 Vitamin D deficiency, unspecified: Secondary | ICD-10-CM | POA: Diagnosis not present

## 2024-06-30 DIAGNOSIS — F50811 Binge eating disorder, moderate: Secondary | ICD-10-CM | POA: Diagnosis not present

## 2024-06-30 DIAGNOSIS — E785 Hyperlipidemia, unspecified: Secondary | ICD-10-CM | POA: Diagnosis not present

## 2024-06-30 DIAGNOSIS — R635 Abnormal weight gain: Secondary | ICD-10-CM | POA: Diagnosis not present

## 2024-07-11 DIAGNOSIS — M25559 Pain in unspecified hip: Secondary | ICD-10-CM | POA: Diagnosis not present

## 2024-07-25 DIAGNOSIS — R635 Abnormal weight gain: Secondary | ICD-10-CM | POA: Diagnosis not present

## 2024-07-25 DIAGNOSIS — E785 Hyperlipidemia, unspecified: Secondary | ICD-10-CM | POA: Diagnosis not present

## 2024-07-25 DIAGNOSIS — E559 Vitamin D deficiency, unspecified: Secondary | ICD-10-CM | POA: Diagnosis not present

## 2024-07-25 DIAGNOSIS — F50811 Binge eating disorder, moderate: Secondary | ICD-10-CM | POA: Diagnosis not present

## 2024-08-29 DIAGNOSIS — M25551 Pain in right hip: Secondary | ICD-10-CM | POA: Diagnosis not present

## 2024-09-27 DIAGNOSIS — E785 Hyperlipidemia, unspecified: Secondary | ICD-10-CM | POA: Diagnosis not present

## 2024-09-27 DIAGNOSIS — R635 Abnormal weight gain: Secondary | ICD-10-CM | POA: Diagnosis not present

## 2024-09-27 DIAGNOSIS — E559 Vitamin D deficiency, unspecified: Secondary | ICD-10-CM | POA: Diagnosis not present

## 2024-09-27 DIAGNOSIS — F50811 Binge eating disorder, moderate: Secondary | ICD-10-CM | POA: Diagnosis not present

## 2024-10-15 DIAGNOSIS — R059 Cough, unspecified: Secondary | ICD-10-CM | POA: Diagnosis not present

## 2024-10-15 DIAGNOSIS — R519 Headache, unspecified: Secondary | ICD-10-CM | POA: Diagnosis not present

## 2024-10-15 DIAGNOSIS — R0981 Nasal congestion: Secondary | ICD-10-CM | POA: Diagnosis not present

## 2024-10-17 DIAGNOSIS — M7061 Trochanteric bursitis, right hip: Secondary | ICD-10-CM | POA: Diagnosis not present
